# Patient Record
Sex: Male | Born: 1950 | Race: White | Hispanic: No | Marital: Married | Smoking: Former smoker
Health system: Southern US, Community
[De-identification: ages and names within clinical notes are randomized; demographics above are authoritative.]

## PROBLEM LIST (undated history)

## (undated) DIAGNOSIS — I10 Essential (primary) hypertension: Secondary | ICD-10-CM

## (undated) DIAGNOSIS — J45909 Unspecified asthma, uncomplicated: Secondary | ICD-10-CM

## (undated) DIAGNOSIS — E785 Hyperlipidemia, unspecified: Secondary | ICD-10-CM

## (undated) HISTORY — PX: SHOULDER SURGERY: SHX246

## (undated) HISTORY — PX: KNEE SURGERY: SHX244

## (undated) HISTORY — DX: Unspecified asthma, uncomplicated: J45.909

## (undated) HISTORY — DX: Essential (primary) hypertension: I10

## (undated) HISTORY — DX: Hyperlipidemia, unspecified: E78.5

---

## 2007-12-23 ENCOUNTER — Emergency Department (HOSPITAL_COMMUNITY): Admission: EM | Admit: 2007-12-23 | Discharge: 2007-12-23 | Payer: Self-pay | Admitting: Emergency Medicine

## 2008-02-27 ENCOUNTER — Encounter (INDEPENDENT_AMBULATORY_CARE_PROVIDER_SITE_OTHER): Payer: Self-pay | Admitting: Orthopedic Surgery

## 2008-02-27 ENCOUNTER — Ambulatory Visit: Payer: Self-pay | Admitting: Vascular Surgery

## 2008-02-27 ENCOUNTER — Ambulatory Visit (HOSPITAL_COMMUNITY): Admission: RE | Admit: 2008-02-27 | Discharge: 2008-02-27 | Payer: Self-pay | Admitting: Orthopedic Surgery

## 2011-06-01 LAB — POCT I-STAT, CHEM 8
BUN: 15
Calcium, Ion: 1.03 — ABNORMAL LOW
Chloride: 107
Creatinine, Ser: 1
Glucose, Bld: 105 — ABNORMAL HIGH
HCT: 40
Hemoglobin: 13.6
Potassium: 3.6
Sodium: 139
TCO2: 22

## 2011-06-01 LAB — SAMPLE TO BLOOD BANK

## 2014-10-10 DIAGNOSIS — G4733 Obstructive sleep apnea (adult) (pediatric): Secondary | ICD-10-CM | POA: Diagnosis not present

## 2015-01-08 DIAGNOSIS — G4733 Obstructive sleep apnea (adult) (pediatric): Secondary | ICD-10-CM | POA: Diagnosis not present

## 2015-04-11 DIAGNOSIS — G4733 Obstructive sleep apnea (adult) (pediatric): Secondary | ICD-10-CM | POA: Diagnosis not present

## 2015-05-19 DIAGNOSIS — Z23 Encounter for immunization: Secondary | ICD-10-CM | POA: Diagnosis not present

## 2015-05-19 DIAGNOSIS — D649 Anemia, unspecified: Secondary | ICD-10-CM | POA: Diagnosis not present

## 2015-05-19 DIAGNOSIS — I10 Essential (primary) hypertension: Secondary | ICD-10-CM | POA: Diagnosis not present

## 2015-05-19 DIAGNOSIS — Z125 Encounter for screening for malignant neoplasm of prostate: Secondary | ICD-10-CM | POA: Diagnosis not present

## 2015-05-19 DIAGNOSIS — Z9181 History of falling: Secondary | ICD-10-CM | POA: Diagnosis not present

## 2015-05-19 DIAGNOSIS — Z0000 Encounter for general adult medical examination without abnormal findings: Secondary | ICD-10-CM | POA: Diagnosis not present

## 2015-05-19 DIAGNOSIS — Z1389 Encounter for screening for other disorder: Secondary | ICD-10-CM | POA: Diagnosis not present

## 2015-05-19 DIAGNOSIS — E780 Pure hypercholesterolemia: Secondary | ICD-10-CM | POA: Diagnosis not present

## 2015-07-15 DIAGNOSIS — G4733 Obstructive sleep apnea (adult) (pediatric): Secondary | ICD-10-CM | POA: Diagnosis not present

## 2015-10-16 DIAGNOSIS — G4733 Obstructive sleep apnea (adult) (pediatric): Secondary | ICD-10-CM | POA: Diagnosis not present

## 2016-01-27 DIAGNOSIS — S76212A Strain of adductor muscle, fascia and tendon of left thigh, initial encounter: Secondary | ICD-10-CM | POA: Diagnosis not present

## 2016-05-04 DIAGNOSIS — G4733 Obstructive sleep apnea (adult) (pediatric): Secondary | ICD-10-CM | POA: Diagnosis not present

## 2016-08-03 DIAGNOSIS — Z79899 Other long term (current) drug therapy: Secondary | ICD-10-CM | POA: Diagnosis not present

## 2016-08-03 DIAGNOSIS — E785 Hyperlipidemia, unspecified: Secondary | ICD-10-CM | POA: Diagnosis not present

## 2016-08-03 DIAGNOSIS — G4730 Sleep apnea, unspecified: Secondary | ICD-10-CM | POA: Diagnosis not present

## 2016-08-03 DIAGNOSIS — G4733 Obstructive sleep apnea (adult) (pediatric): Secondary | ICD-10-CM | POA: Diagnosis not present

## 2016-08-03 DIAGNOSIS — I10 Essential (primary) hypertension: Secondary | ICD-10-CM | POA: Diagnosis not present

## 2016-08-03 DIAGNOSIS — Z0000 Encounter for general adult medical examination without abnormal findings: Secondary | ICD-10-CM | POA: Diagnosis not present

## 2016-08-03 DIAGNOSIS — L720 Epidermal cyst: Secondary | ICD-10-CM | POA: Diagnosis not present

## 2016-08-03 DIAGNOSIS — Z23 Encounter for immunization: Secondary | ICD-10-CM | POA: Diagnosis not present

## 2016-08-03 DIAGNOSIS — Z125 Encounter for screening for malignant neoplasm of prostate: Secondary | ICD-10-CM | POA: Diagnosis not present

## 2016-10-15 DIAGNOSIS — J329 Chronic sinusitis, unspecified: Secondary | ICD-10-CM | POA: Diagnosis not present

## 2016-11-01 DIAGNOSIS — G4733 Obstructive sleep apnea (adult) (pediatric): Secondary | ICD-10-CM | POA: Diagnosis not present

## 2016-11-21 DIAGNOSIS — J209 Acute bronchitis, unspecified: Secondary | ICD-10-CM | POA: Diagnosis not present

## 2016-11-21 DIAGNOSIS — R062 Wheezing: Secondary | ICD-10-CM | POA: Diagnosis not present

## 2017-02-02 DIAGNOSIS — J40 Bronchitis, not specified as acute or chronic: Secondary | ICD-10-CM | POA: Diagnosis not present

## 2017-02-02 DIAGNOSIS — G4733 Obstructive sleep apnea (adult) (pediatric): Secondary | ICD-10-CM | POA: Diagnosis not present

## 2017-02-02 DIAGNOSIS — J329 Chronic sinusitis, unspecified: Secondary | ICD-10-CM | POA: Diagnosis not present

## 2017-02-12 DIAGNOSIS — R0602 Shortness of breath: Secondary | ICD-10-CM | POA: Diagnosis not present

## 2017-02-12 DIAGNOSIS — R0600 Dyspnea, unspecified: Secondary | ICD-10-CM | POA: Diagnosis not present

## 2017-02-12 DIAGNOSIS — J324 Chronic pansinusitis: Secondary | ICD-10-CM | POA: Diagnosis not present

## 2017-02-16 DIAGNOSIS — Z1211 Encounter for screening for malignant neoplasm of colon: Secondary | ICD-10-CM | POA: Diagnosis not present

## 2017-02-16 DIAGNOSIS — Z86010 Personal history of colonic polyps: Secondary | ICD-10-CM | POA: Diagnosis not present

## 2017-02-18 DIAGNOSIS — J209 Acute bronchitis, unspecified: Secondary | ICD-10-CM | POA: Diagnosis not present

## 2017-02-18 DIAGNOSIS — J9602 Acute respiratory failure with hypercapnia: Secondary | ICD-10-CM | POA: Diagnosis not present

## 2017-02-18 DIAGNOSIS — Z87891 Personal history of nicotine dependence: Secondary | ICD-10-CM | POA: Diagnosis not present

## 2017-02-18 DIAGNOSIS — J9601 Acute respiratory failure with hypoxia: Secondary | ICD-10-CM | POA: Diagnosis not present

## 2017-02-18 DIAGNOSIS — J449 Chronic obstructive pulmonary disease, unspecified: Secondary | ICD-10-CM | POA: Diagnosis not present

## 2017-02-18 DIAGNOSIS — R0602 Shortness of breath: Secondary | ICD-10-CM | POA: Diagnosis not present

## 2017-02-18 DIAGNOSIS — E785 Hyperlipidemia, unspecified: Secondary | ICD-10-CM | POA: Diagnosis not present

## 2017-02-18 DIAGNOSIS — J440 Chronic obstructive pulmonary disease with acute lower respiratory infection: Secondary | ICD-10-CM | POA: Diagnosis not present

## 2017-02-18 DIAGNOSIS — I712 Thoracic aortic aneurysm, without rupture: Secondary | ICD-10-CM | POA: Diagnosis not present

## 2017-02-18 DIAGNOSIS — M1990 Unspecified osteoarthritis, unspecified site: Secondary | ICD-10-CM | POA: Diagnosis not present

## 2017-02-18 DIAGNOSIS — Z79899 Other long term (current) drug therapy: Secondary | ICD-10-CM | POA: Diagnosis not present

## 2017-02-18 DIAGNOSIS — J441 Chronic obstructive pulmonary disease with (acute) exacerbation: Secondary | ICD-10-CM | POA: Diagnosis not present

## 2017-02-18 DIAGNOSIS — I10 Essential (primary) hypertension: Secondary | ICD-10-CM | POA: Diagnosis not present

## 2017-02-18 DIAGNOSIS — R062 Wheezing: Secondary | ICD-10-CM | POA: Diagnosis not present

## 2017-02-18 DIAGNOSIS — J9600 Acute respiratory failure, unspecified whether with hypoxia or hypercapnia: Secondary | ICD-10-CM | POA: Diagnosis not present

## 2017-02-18 DIAGNOSIS — E870 Hyperosmolality and hypernatremia: Secondary | ICD-10-CM | POA: Diagnosis not present

## 2017-02-18 DIAGNOSIS — Z7982 Long term (current) use of aspirin: Secondary | ICD-10-CM | POA: Diagnosis not present

## 2017-02-21 DIAGNOSIS — J449 Chronic obstructive pulmonary disease, unspecified: Secondary | ICD-10-CM

## 2017-03-01 DIAGNOSIS — J9612 Chronic respiratory failure with hypercapnia: Secondary | ICD-10-CM | POA: Diagnosis not present

## 2017-03-01 DIAGNOSIS — J9611 Chronic respiratory failure with hypoxia: Secondary | ICD-10-CM | POA: Diagnosis not present

## 2017-03-01 DIAGNOSIS — Z9981 Dependence on supplemental oxygen: Secondary | ICD-10-CM | POA: Diagnosis not present

## 2017-03-01 DIAGNOSIS — J449 Chronic obstructive pulmonary disease, unspecified: Secondary | ICD-10-CM | POA: Diagnosis not present

## 2017-03-01 DIAGNOSIS — R739 Hyperglycemia, unspecified: Secondary | ICD-10-CM | POA: Diagnosis not present

## 2017-03-01 DIAGNOSIS — Z87891 Personal history of nicotine dependence: Secondary | ICD-10-CM | POA: Diagnosis not present

## 2017-03-21 DIAGNOSIS — G4733 Obstructive sleep apnea (adult) (pediatric): Secondary | ICD-10-CM | POA: Diagnosis not present

## 2017-03-21 DIAGNOSIS — J4520 Mild intermittent asthma, uncomplicated: Secondary | ICD-10-CM | POA: Diagnosis not present

## 2017-03-23 DIAGNOSIS — J449 Chronic obstructive pulmonary disease, unspecified: Secondary | ICD-10-CM | POA: Diagnosis not present

## 2017-04-23 DIAGNOSIS — J449 Chronic obstructive pulmonary disease, unspecified: Secondary | ICD-10-CM | POA: Diagnosis not present

## 2017-04-27 DIAGNOSIS — G4733 Obstructive sleep apnea (adult) (pediatric): Secondary | ICD-10-CM | POA: Diagnosis not present

## 2017-05-16 DIAGNOSIS — R05 Cough: Secondary | ICD-10-CM | POA: Diagnosis not present

## 2017-05-16 DIAGNOSIS — J449 Chronic obstructive pulmonary disease, unspecified: Secondary | ICD-10-CM | POA: Diagnosis not present

## 2017-05-16 DIAGNOSIS — G4733 Obstructive sleep apnea (adult) (pediatric): Secondary | ICD-10-CM | POA: Diagnosis not present

## 2017-05-24 DIAGNOSIS — J449 Chronic obstructive pulmonary disease, unspecified: Secondary | ICD-10-CM | POA: Diagnosis not present

## 2017-06-21 DIAGNOSIS — G4733 Obstructive sleep apnea (adult) (pediatric): Secondary | ICD-10-CM | POA: Diagnosis not present

## 2017-06-23 DIAGNOSIS — J449 Chronic obstructive pulmonary disease, unspecified: Secondary | ICD-10-CM | POA: Diagnosis not present

## 2017-06-24 DIAGNOSIS — J441 Chronic obstructive pulmonary disease with (acute) exacerbation: Secondary | ICD-10-CM | POA: Diagnosis not present

## 2017-06-24 DIAGNOSIS — G4733 Obstructive sleep apnea (adult) (pediatric): Secondary | ICD-10-CM | POA: Diagnosis not present

## 2017-06-27 DIAGNOSIS — G4733 Obstructive sleep apnea (adult) (pediatric): Secondary | ICD-10-CM | POA: Diagnosis not present

## 2017-06-27 DIAGNOSIS — J441 Chronic obstructive pulmonary disease with (acute) exacerbation: Secondary | ICD-10-CM | POA: Diagnosis not present

## 2017-07-11 DIAGNOSIS — G4733 Obstructive sleep apnea (adult) (pediatric): Secondary | ICD-10-CM | POA: Diagnosis not present

## 2017-07-22 DIAGNOSIS — Z23 Encounter for immunization: Secondary | ICD-10-CM | POA: Diagnosis not present

## 2017-07-24 DIAGNOSIS — J449 Chronic obstructive pulmonary disease, unspecified: Secondary | ICD-10-CM | POA: Diagnosis not present

## 2017-08-01 DIAGNOSIS — J441 Chronic obstructive pulmonary disease with (acute) exacerbation: Secondary | ICD-10-CM | POA: Diagnosis not present

## 2017-08-23 DIAGNOSIS — J449 Chronic obstructive pulmonary disease, unspecified: Secondary | ICD-10-CM | POA: Diagnosis not present

## 2017-09-16 DIAGNOSIS — J449 Chronic obstructive pulmonary disease, unspecified: Secondary | ICD-10-CM | POA: Diagnosis not present

## 2017-09-23 DIAGNOSIS — J449 Chronic obstructive pulmonary disease, unspecified: Secondary | ICD-10-CM | POA: Diagnosis not present

## 2017-10-03 DIAGNOSIS — J449 Chronic obstructive pulmonary disease, unspecified: Secondary | ICD-10-CM | POA: Diagnosis not present

## 2017-10-03 DIAGNOSIS — G4733 Obstructive sleep apnea (adult) (pediatric): Secondary | ICD-10-CM | POA: Diagnosis not present

## 2017-10-04 DIAGNOSIS — G4733 Obstructive sleep apnea (adult) (pediatric): Secondary | ICD-10-CM | POA: Diagnosis not present

## 2017-10-12 DIAGNOSIS — I10 Essential (primary) hypertension: Secondary | ICD-10-CM | POA: Diagnosis not present

## 2017-10-12 DIAGNOSIS — E785 Hyperlipidemia, unspecified: Secondary | ICD-10-CM | POA: Diagnosis not present

## 2017-10-12 DIAGNOSIS — Z125 Encounter for screening for malignant neoplasm of prostate: Secondary | ICD-10-CM | POA: Diagnosis not present

## 2017-10-12 DIAGNOSIS — Z79899 Other long term (current) drug therapy: Secondary | ICD-10-CM | POA: Diagnosis not present

## 2017-10-12 DIAGNOSIS — I712 Thoracic aortic aneurysm, without rupture: Secondary | ICD-10-CM | POA: Diagnosis not present

## 2017-10-12 DIAGNOSIS — Z0000 Encounter for general adult medical examination without abnormal findings: Secondary | ICD-10-CM | POA: Diagnosis not present

## 2017-10-12 DIAGNOSIS — R739 Hyperglycemia, unspecified: Secondary | ICD-10-CM | POA: Diagnosis not present

## 2017-10-24 DIAGNOSIS — J449 Chronic obstructive pulmonary disease, unspecified: Secondary | ICD-10-CM | POA: Diagnosis not present

## 2017-10-27 DIAGNOSIS — G4733 Obstructive sleep apnea (adult) (pediatric): Secondary | ICD-10-CM | POA: Diagnosis not present

## 2017-10-27 DIAGNOSIS — J441 Chronic obstructive pulmonary disease with (acute) exacerbation: Secondary | ICD-10-CM | POA: Diagnosis not present

## 2017-10-31 DIAGNOSIS — G4733 Obstructive sleep apnea (adult) (pediatric): Secondary | ICD-10-CM | POA: Diagnosis not present

## 2017-11-21 DIAGNOSIS — J449 Chronic obstructive pulmonary disease, unspecified: Secondary | ICD-10-CM | POA: Diagnosis not present

## 2017-11-22 DIAGNOSIS — J449 Chronic obstructive pulmonary disease, unspecified: Secondary | ICD-10-CM | POA: Diagnosis not present

## 2017-11-22 DIAGNOSIS — G4730 Sleep apnea, unspecified: Secondary | ICD-10-CM | POA: Diagnosis not present

## 2017-11-22 DIAGNOSIS — J209 Acute bronchitis, unspecified: Secondary | ICD-10-CM | POA: Diagnosis not present

## 2017-11-22 DIAGNOSIS — R05 Cough: Secondary | ICD-10-CM | POA: Diagnosis not present

## 2017-11-28 DIAGNOSIS — J441 Chronic obstructive pulmonary disease with (acute) exacerbation: Secondary | ICD-10-CM | POA: Diagnosis not present

## 2017-11-28 DIAGNOSIS — G4733 Obstructive sleep apnea (adult) (pediatric): Secondary | ICD-10-CM | POA: Diagnosis not present

## 2017-11-28 DIAGNOSIS — J301 Allergic rhinitis due to pollen: Secondary | ICD-10-CM | POA: Diagnosis not present

## 2017-12-08 DIAGNOSIS — J441 Chronic obstructive pulmonary disease with (acute) exacerbation: Secondary | ICD-10-CM | POA: Diagnosis not present

## 2017-12-19 DIAGNOSIS — J449 Chronic obstructive pulmonary disease, unspecified: Secondary | ICD-10-CM | POA: Diagnosis not present

## 2017-12-19 DIAGNOSIS — G4730 Sleep apnea, unspecified: Secondary | ICD-10-CM | POA: Diagnosis not present

## 2017-12-19 DIAGNOSIS — J301 Allergic rhinitis due to pollen: Secondary | ICD-10-CM | POA: Diagnosis not present

## 2017-12-22 DIAGNOSIS — J449 Chronic obstructive pulmonary disease, unspecified: Secondary | ICD-10-CM | POA: Diagnosis not present

## 2018-01-21 DIAGNOSIS — J449 Chronic obstructive pulmonary disease, unspecified: Secondary | ICD-10-CM | POA: Diagnosis not present

## 2018-01-30 DIAGNOSIS — G4733 Obstructive sleep apnea (adult) (pediatric): Secondary | ICD-10-CM | POA: Diagnosis not present

## 2018-02-21 DIAGNOSIS — J449 Chronic obstructive pulmonary disease, unspecified: Secondary | ICD-10-CM | POA: Diagnosis not present

## 2018-03-20 DIAGNOSIS — J4520 Mild intermittent asthma, uncomplicated: Secondary | ICD-10-CM | POA: Diagnosis not present

## 2018-03-20 DIAGNOSIS — J301 Allergic rhinitis due to pollen: Secondary | ICD-10-CM | POA: Diagnosis not present

## 2018-03-20 DIAGNOSIS — G4730 Sleep apnea, unspecified: Secondary | ICD-10-CM | POA: Diagnosis not present

## 2018-03-20 DIAGNOSIS — J449 Chronic obstructive pulmonary disease, unspecified: Secondary | ICD-10-CM | POA: Diagnosis not present

## 2018-03-21 DIAGNOSIS — L02212 Cutaneous abscess of back [any part, except buttock]: Secondary | ICD-10-CM | POA: Diagnosis not present

## 2018-03-23 DIAGNOSIS — J449 Chronic obstructive pulmonary disease, unspecified: Secondary | ICD-10-CM | POA: Diagnosis not present

## 2018-04-03 DIAGNOSIS — L720 Epidermal cyst: Secondary | ICD-10-CM | POA: Diagnosis not present

## 2018-04-12 DIAGNOSIS — I10 Essential (primary) hypertension: Secondary | ICD-10-CM | POA: Diagnosis not present

## 2018-04-12 DIAGNOSIS — Z79899 Other long term (current) drug therapy: Secondary | ICD-10-CM | POA: Diagnosis not present

## 2018-04-12 DIAGNOSIS — E785 Hyperlipidemia, unspecified: Secondary | ICD-10-CM | POA: Diagnosis not present

## 2018-04-12 DIAGNOSIS — I712 Thoracic aortic aneurysm, without rupture: Secondary | ICD-10-CM | POA: Diagnosis not present

## 2018-04-12 DIAGNOSIS — G4730 Sleep apnea, unspecified: Secondary | ICD-10-CM | POA: Diagnosis not present

## 2018-04-19 DIAGNOSIS — I719 Aortic aneurysm of unspecified site, without rupture: Secondary | ICD-10-CM | POA: Diagnosis not present

## 2018-04-19 DIAGNOSIS — I712 Thoracic aortic aneurysm, without rupture: Secondary | ICD-10-CM | POA: Diagnosis not present

## 2018-04-23 DIAGNOSIS — J449 Chronic obstructive pulmonary disease, unspecified: Secondary | ICD-10-CM | POA: Diagnosis not present

## 2018-05-01 DIAGNOSIS — G4733 Obstructive sleep apnea (adult) (pediatric): Secondary | ICD-10-CM | POA: Diagnosis not present

## 2018-05-22 DIAGNOSIS — J301 Allergic rhinitis due to pollen: Secondary | ICD-10-CM | POA: Diagnosis not present

## 2018-05-22 DIAGNOSIS — J4530 Mild persistent asthma, uncomplicated: Secondary | ICD-10-CM | POA: Diagnosis not present

## 2018-05-22 DIAGNOSIS — G4730 Sleep apnea, unspecified: Secondary | ICD-10-CM | POA: Diagnosis not present

## 2018-05-24 DIAGNOSIS — J449 Chronic obstructive pulmonary disease, unspecified: Secondary | ICD-10-CM | POA: Diagnosis not present

## 2018-06-23 DIAGNOSIS — J449 Chronic obstructive pulmonary disease, unspecified: Secondary | ICD-10-CM | POA: Diagnosis not present

## 2018-07-21 DIAGNOSIS — H2513 Age-related nuclear cataract, bilateral: Secondary | ICD-10-CM | POA: Diagnosis not present

## 2018-07-21 DIAGNOSIS — H35362 Drusen (degenerative) of macula, left eye: Secondary | ICD-10-CM | POA: Diagnosis not present

## 2018-07-24 DIAGNOSIS — J449 Chronic obstructive pulmonary disease, unspecified: Secondary | ICD-10-CM | POA: Diagnosis not present

## 2018-07-31 DIAGNOSIS — G4733 Obstructive sleep apnea (adult) (pediatric): Secondary | ICD-10-CM | POA: Diagnosis not present

## 2018-08-10 DIAGNOSIS — Z23 Encounter for immunization: Secondary | ICD-10-CM | POA: Diagnosis not present

## 2018-08-21 DIAGNOSIS — J301 Allergic rhinitis due to pollen: Secondary | ICD-10-CM | POA: Diagnosis not present

## 2018-08-21 DIAGNOSIS — G4730 Sleep apnea, unspecified: Secondary | ICD-10-CM | POA: Diagnosis not present

## 2018-08-21 DIAGNOSIS — J4520 Mild intermittent asthma, uncomplicated: Secondary | ICD-10-CM | POA: Diagnosis not present

## 2018-08-23 DIAGNOSIS — J449 Chronic obstructive pulmonary disease, unspecified: Secondary | ICD-10-CM | POA: Diagnosis not present

## 2018-08-24 DIAGNOSIS — G4733 Obstructive sleep apnea (adult) (pediatric): Secondary | ICD-10-CM | POA: Diagnosis not present

## 2018-09-11 DIAGNOSIS — J4520 Mild intermittent asthma, uncomplicated: Secondary | ICD-10-CM | POA: Diagnosis not present

## 2018-09-11 DIAGNOSIS — G4730 Sleep apnea, unspecified: Secondary | ICD-10-CM | POA: Diagnosis not present

## 2018-09-11 DIAGNOSIS — J301 Allergic rhinitis due to pollen: Secondary | ICD-10-CM | POA: Diagnosis not present

## 2018-09-28 DIAGNOSIS — H2513 Age-related nuclear cataract, bilateral: Secondary | ICD-10-CM | POA: Diagnosis not present

## 2018-09-28 DIAGNOSIS — H25043 Posterior subcapsular polar age-related cataract, bilateral: Secondary | ICD-10-CM | POA: Diagnosis not present

## 2018-09-28 DIAGNOSIS — H02831 Dermatochalasis of right upper eyelid: Secondary | ICD-10-CM | POA: Diagnosis not present

## 2018-09-28 DIAGNOSIS — H2512 Age-related nuclear cataract, left eye: Secondary | ICD-10-CM | POA: Diagnosis not present

## 2018-09-28 DIAGNOSIS — H18413 Arcus senilis, bilateral: Secondary | ICD-10-CM | POA: Diagnosis not present

## 2018-10-06 DIAGNOSIS — H2512 Age-related nuclear cataract, left eye: Secondary | ICD-10-CM | POA: Diagnosis not present

## 2018-10-06 DIAGNOSIS — H2511 Age-related nuclear cataract, right eye: Secondary | ICD-10-CM | POA: Diagnosis not present

## 2018-10-27 DIAGNOSIS — H2511 Age-related nuclear cataract, right eye: Secondary | ICD-10-CM | POA: Diagnosis not present

## 2018-10-30 DIAGNOSIS — G4733 Obstructive sleep apnea (adult) (pediatric): Secondary | ICD-10-CM | POA: Diagnosis not present

## 2018-11-17 DIAGNOSIS — I10 Essential (primary) hypertension: Secondary | ICD-10-CM | POA: Diagnosis not present

## 2018-11-17 DIAGNOSIS — Z0000 Encounter for general adult medical examination without abnormal findings: Secondary | ICD-10-CM | POA: Diagnosis not present

## 2018-11-17 DIAGNOSIS — G4730 Sleep apnea, unspecified: Secondary | ICD-10-CM | POA: Diagnosis not present

## 2018-11-17 DIAGNOSIS — E785 Hyperlipidemia, unspecified: Secondary | ICD-10-CM | POA: Diagnosis not present

## 2018-11-17 DIAGNOSIS — Z1159 Encounter for screening for other viral diseases: Secondary | ICD-10-CM | POA: Diagnosis not present

## 2018-11-17 DIAGNOSIS — I712 Thoracic aortic aneurysm, without rupture: Secondary | ICD-10-CM | POA: Diagnosis not present

## 2018-11-17 DIAGNOSIS — Z79899 Other long term (current) drug therapy: Secondary | ICD-10-CM | POA: Diagnosis not present

## 2019-01-31 DIAGNOSIS — G4733 Obstructive sleep apnea (adult) (pediatric): Secondary | ICD-10-CM | POA: Diagnosis not present

## 2019-02-19 DIAGNOSIS — J4520 Mild intermittent asthma, uncomplicated: Secondary | ICD-10-CM | POA: Diagnosis not present

## 2019-02-19 DIAGNOSIS — G4730 Sleep apnea, unspecified: Secondary | ICD-10-CM | POA: Diagnosis not present

## 2019-02-19 DIAGNOSIS — J301 Allergic rhinitis due to pollen: Secondary | ICD-10-CM | POA: Diagnosis not present

## 2019-03-29 DIAGNOSIS — M19011 Primary osteoarthritis, right shoulder: Secondary | ICD-10-CM | POA: Diagnosis not present

## 2019-04-26 DIAGNOSIS — I10 Essential (primary) hypertension: Secondary | ICD-10-CM | POA: Diagnosis not present

## 2019-04-26 DIAGNOSIS — G4730 Sleep apnea, unspecified: Secondary | ICD-10-CM | POA: Diagnosis not present

## 2019-04-26 DIAGNOSIS — R739 Hyperglycemia, unspecified: Secondary | ICD-10-CM | POA: Diagnosis not present

## 2019-04-26 DIAGNOSIS — E785 Hyperlipidemia, unspecified: Secondary | ICD-10-CM | POA: Diagnosis not present

## 2019-04-26 DIAGNOSIS — I712 Thoracic aortic aneurysm, without rupture: Secondary | ICD-10-CM | POA: Diagnosis not present

## 2019-04-30 DIAGNOSIS — I719 Aortic aneurysm of unspecified site, without rupture: Secondary | ICD-10-CM | POA: Diagnosis not present

## 2019-04-30 DIAGNOSIS — I712 Thoracic aortic aneurysm, without rupture: Secondary | ICD-10-CM | POA: Diagnosis not present

## 2019-05-01 DIAGNOSIS — G4733 Obstructive sleep apnea (adult) (pediatric): Secondary | ICD-10-CM | POA: Diagnosis not present

## 2019-05-25 DIAGNOSIS — M25511 Pain in right shoulder: Secondary | ICD-10-CM | POA: Diagnosis not present

## 2019-05-25 DIAGNOSIS — S43401A Unspecified sprain of right shoulder joint, initial encounter: Secondary | ICD-10-CM | POA: Diagnosis not present

## 2019-05-31 DIAGNOSIS — M19011 Primary osteoarthritis, right shoulder: Secondary | ICD-10-CM | POA: Diagnosis not present

## 2019-05-31 DIAGNOSIS — M67911 Unspecified disorder of synovium and tendon, right shoulder: Secondary | ICD-10-CM | POA: Diagnosis not present

## 2019-06-16 DIAGNOSIS — Z23 Encounter for immunization: Secondary | ICD-10-CM | POA: Diagnosis not present

## 2019-06-28 DIAGNOSIS — M7551 Bursitis of right shoulder: Secondary | ICD-10-CM | POA: Diagnosis not present

## 2019-06-28 DIAGNOSIS — M19011 Primary osteoarthritis, right shoulder: Secondary | ICD-10-CM | POA: Diagnosis not present

## 2019-07-13 DIAGNOSIS — Z79899 Other long term (current) drug therapy: Secondary | ICD-10-CM | POA: Diagnosis not present

## 2019-07-13 DIAGNOSIS — M67911 Unspecified disorder of synovium and tendon, right shoulder: Secondary | ICD-10-CM | POA: Diagnosis not present

## 2019-07-13 DIAGNOSIS — Z7982 Long term (current) use of aspirin: Secondary | ICD-10-CM | POA: Diagnosis not present

## 2019-07-13 DIAGNOSIS — S43421A Sprain of right rotator cuff capsule, initial encounter: Secondary | ICD-10-CM | POA: Diagnosis not present

## 2019-07-13 DIAGNOSIS — M24111 Other articular cartilage disorders, right shoulder: Secondary | ICD-10-CM | POA: Diagnosis not present

## 2019-07-13 DIAGNOSIS — M75111 Incomplete rotator cuff tear or rupture of right shoulder, not specified as traumatic: Secondary | ICD-10-CM | POA: Diagnosis not present

## 2019-07-13 DIAGNOSIS — M75101 Unspecified rotator cuff tear or rupture of right shoulder, not specified as traumatic: Secondary | ICD-10-CM | POA: Diagnosis not present

## 2019-07-13 DIAGNOSIS — M7521 Bicipital tendinitis, right shoulder: Secondary | ICD-10-CM | POA: Diagnosis not present

## 2019-07-13 DIAGNOSIS — S43431A Superior glenoid labrum lesion of right shoulder, initial encounter: Secondary | ICD-10-CM | POA: Diagnosis not present

## 2019-07-13 DIAGNOSIS — G8918 Other acute postprocedural pain: Secondary | ICD-10-CM | POA: Diagnosis not present

## 2019-07-13 DIAGNOSIS — M19011 Primary osteoarthritis, right shoulder: Secondary | ICD-10-CM | POA: Diagnosis not present

## 2019-07-13 DIAGNOSIS — M7551 Bursitis of right shoulder: Secondary | ICD-10-CM | POA: Diagnosis not present

## 2019-07-13 DIAGNOSIS — E7800 Pure hypercholesterolemia, unspecified: Secondary | ICD-10-CM | POA: Diagnosis not present

## 2019-07-13 DIAGNOSIS — J449 Chronic obstructive pulmonary disease, unspecified: Secondary | ICD-10-CM | POA: Diagnosis not present

## 2019-07-13 DIAGNOSIS — M1990 Unspecified osteoarthritis, unspecified site: Secondary | ICD-10-CM | POA: Diagnosis not present

## 2019-07-13 DIAGNOSIS — G4730 Sleep apnea, unspecified: Secondary | ICD-10-CM | POA: Diagnosis not present

## 2019-07-13 DIAGNOSIS — Z8719 Personal history of other diseases of the digestive system: Secondary | ICD-10-CM | POA: Diagnosis not present

## 2019-07-13 DIAGNOSIS — I10 Essential (primary) hypertension: Secondary | ICD-10-CM | POA: Diagnosis not present

## 2019-07-17 DIAGNOSIS — M25511 Pain in right shoulder: Secondary | ICD-10-CM | POA: Diagnosis not present

## 2019-07-17 DIAGNOSIS — M6281 Muscle weakness (generalized): Secondary | ICD-10-CM | POA: Diagnosis not present

## 2019-07-17 DIAGNOSIS — M25611 Stiffness of right shoulder, not elsewhere classified: Secondary | ICD-10-CM | POA: Diagnosis not present

## 2019-07-19 DIAGNOSIS — M6281 Muscle weakness (generalized): Secondary | ICD-10-CM | POA: Diagnosis not present

## 2019-07-19 DIAGNOSIS — M25611 Stiffness of right shoulder, not elsewhere classified: Secondary | ICD-10-CM | POA: Diagnosis not present

## 2019-07-19 DIAGNOSIS — M25511 Pain in right shoulder: Secondary | ICD-10-CM | POA: Diagnosis not present

## 2019-07-23 DIAGNOSIS — M25611 Stiffness of right shoulder, not elsewhere classified: Secondary | ICD-10-CM | POA: Diagnosis not present

## 2019-07-23 DIAGNOSIS — M25511 Pain in right shoulder: Secondary | ICD-10-CM | POA: Diagnosis not present

## 2019-07-23 DIAGNOSIS — M6281 Muscle weakness (generalized): Secondary | ICD-10-CM | POA: Diagnosis not present

## 2019-07-26 DIAGNOSIS — M25511 Pain in right shoulder: Secondary | ICD-10-CM | POA: Diagnosis not present

## 2019-07-26 DIAGNOSIS — M25611 Stiffness of right shoulder, not elsewhere classified: Secondary | ICD-10-CM | POA: Diagnosis not present

## 2019-07-26 DIAGNOSIS — M6281 Muscle weakness (generalized): Secondary | ICD-10-CM | POA: Diagnosis not present

## 2019-07-30 DIAGNOSIS — M25611 Stiffness of right shoulder, not elsewhere classified: Secondary | ICD-10-CM | POA: Diagnosis not present

## 2019-07-30 DIAGNOSIS — M25511 Pain in right shoulder: Secondary | ICD-10-CM | POA: Diagnosis not present

## 2019-07-30 DIAGNOSIS — M6281 Muscle weakness (generalized): Secondary | ICD-10-CM | POA: Diagnosis not present

## 2019-08-03 DIAGNOSIS — M25511 Pain in right shoulder: Secondary | ICD-10-CM | POA: Diagnosis not present

## 2019-08-03 DIAGNOSIS — M25611 Stiffness of right shoulder, not elsewhere classified: Secondary | ICD-10-CM | POA: Diagnosis not present

## 2019-08-03 DIAGNOSIS — M6281 Muscle weakness (generalized): Secondary | ICD-10-CM | POA: Diagnosis not present

## 2019-08-09 DIAGNOSIS — J4520 Mild intermittent asthma, uncomplicated: Secondary | ICD-10-CM | POA: Diagnosis not present

## 2019-08-09 DIAGNOSIS — G4730 Sleep apnea, unspecified: Secondary | ICD-10-CM | POA: Diagnosis not present

## 2019-08-09 DIAGNOSIS — J301 Allergic rhinitis due to pollen: Secondary | ICD-10-CM | POA: Diagnosis not present

## 2019-08-10 DIAGNOSIS — G4733 Obstructive sleep apnea (adult) (pediatric): Secondary | ICD-10-CM | POA: Diagnosis not present

## 2019-08-13 DIAGNOSIS — M6281 Muscle weakness (generalized): Secondary | ICD-10-CM | POA: Diagnosis not present

## 2019-08-13 DIAGNOSIS — M25511 Pain in right shoulder: Secondary | ICD-10-CM | POA: Diagnosis not present

## 2019-08-13 DIAGNOSIS — M25611 Stiffness of right shoulder, not elsewhere classified: Secondary | ICD-10-CM | POA: Diagnosis not present

## 2019-08-16 DIAGNOSIS — M6281 Muscle weakness (generalized): Secondary | ICD-10-CM | POA: Diagnosis not present

## 2019-08-16 DIAGNOSIS — M25611 Stiffness of right shoulder, not elsewhere classified: Secondary | ICD-10-CM | POA: Diagnosis not present

## 2019-08-16 DIAGNOSIS — M25511 Pain in right shoulder: Secondary | ICD-10-CM | POA: Diagnosis not present

## 2019-08-21 DIAGNOSIS — M25611 Stiffness of right shoulder, not elsewhere classified: Secondary | ICD-10-CM | POA: Diagnosis not present

## 2019-08-21 DIAGNOSIS — M6281 Muscle weakness (generalized): Secondary | ICD-10-CM | POA: Diagnosis not present

## 2019-08-21 DIAGNOSIS — M25511 Pain in right shoulder: Secondary | ICD-10-CM | POA: Diagnosis not present

## 2019-08-23 DIAGNOSIS — G4730 Sleep apnea, unspecified: Secondary | ICD-10-CM | POA: Diagnosis not present

## 2019-08-23 DIAGNOSIS — J4520 Mild intermittent asthma, uncomplicated: Secondary | ICD-10-CM | POA: Diagnosis not present

## 2019-08-23 DIAGNOSIS — J301 Allergic rhinitis due to pollen: Secondary | ICD-10-CM | POA: Diagnosis not present

## 2019-08-27 DIAGNOSIS — M6281 Muscle weakness (generalized): Secondary | ICD-10-CM | POA: Diagnosis not present

## 2019-08-27 DIAGNOSIS — M25511 Pain in right shoulder: Secondary | ICD-10-CM | POA: Diagnosis not present

## 2019-08-27 DIAGNOSIS — M25611 Stiffness of right shoulder, not elsewhere classified: Secondary | ICD-10-CM | POA: Diagnosis not present

## 2019-09-03 DIAGNOSIS — M6281 Muscle weakness (generalized): Secondary | ICD-10-CM | POA: Diagnosis not present

## 2019-09-03 DIAGNOSIS — M25611 Stiffness of right shoulder, not elsewhere classified: Secondary | ICD-10-CM | POA: Diagnosis not present

## 2019-09-03 DIAGNOSIS — M25511 Pain in right shoulder: Secondary | ICD-10-CM | POA: Diagnosis not present

## 2019-09-10 DIAGNOSIS — M25511 Pain in right shoulder: Secondary | ICD-10-CM | POA: Diagnosis not present

## 2019-09-10 DIAGNOSIS — M25611 Stiffness of right shoulder, not elsewhere classified: Secondary | ICD-10-CM | POA: Diagnosis not present

## 2019-09-10 DIAGNOSIS — M6281 Muscle weakness (generalized): Secondary | ICD-10-CM | POA: Diagnosis not present

## 2019-09-13 DIAGNOSIS — M6281 Muscle weakness (generalized): Secondary | ICD-10-CM | POA: Diagnosis not present

## 2019-09-13 DIAGNOSIS — M25511 Pain in right shoulder: Secondary | ICD-10-CM | POA: Diagnosis not present

## 2019-09-13 DIAGNOSIS — M25611 Stiffness of right shoulder, not elsewhere classified: Secondary | ICD-10-CM | POA: Diagnosis not present

## 2019-09-17 DIAGNOSIS — M25511 Pain in right shoulder: Secondary | ICD-10-CM | POA: Diagnosis not present

## 2019-09-17 DIAGNOSIS — M6281 Muscle weakness (generalized): Secondary | ICD-10-CM | POA: Diagnosis not present

## 2019-09-17 DIAGNOSIS — M25611 Stiffness of right shoulder, not elsewhere classified: Secondary | ICD-10-CM | POA: Diagnosis not present

## 2019-09-20 DIAGNOSIS — M6281 Muscle weakness (generalized): Secondary | ICD-10-CM | POA: Diagnosis not present

## 2019-09-20 DIAGNOSIS — M25611 Stiffness of right shoulder, not elsewhere classified: Secondary | ICD-10-CM | POA: Diagnosis not present

## 2019-09-20 DIAGNOSIS — M25511 Pain in right shoulder: Secondary | ICD-10-CM | POA: Diagnosis not present

## 2019-09-24 DIAGNOSIS — M25511 Pain in right shoulder: Secondary | ICD-10-CM | POA: Diagnosis not present

## 2019-09-24 DIAGNOSIS — M6281 Muscle weakness (generalized): Secondary | ICD-10-CM | POA: Diagnosis not present

## 2019-09-24 DIAGNOSIS — M25611 Stiffness of right shoulder, not elsewhere classified: Secondary | ICD-10-CM | POA: Diagnosis not present

## 2019-09-26 DIAGNOSIS — G4733 Obstructive sleep apnea (adult) (pediatric): Secondary | ICD-10-CM | POA: Diagnosis not present

## 2019-09-27 DIAGNOSIS — M6281 Muscle weakness (generalized): Secondary | ICD-10-CM | POA: Diagnosis not present

## 2019-09-27 DIAGNOSIS — M25511 Pain in right shoulder: Secondary | ICD-10-CM | POA: Diagnosis not present

## 2019-09-27 DIAGNOSIS — M25611 Stiffness of right shoulder, not elsewhere classified: Secondary | ICD-10-CM | POA: Diagnosis not present

## 2019-10-01 DIAGNOSIS — M25511 Pain in right shoulder: Secondary | ICD-10-CM | POA: Diagnosis not present

## 2019-10-01 DIAGNOSIS — M25611 Stiffness of right shoulder, not elsewhere classified: Secondary | ICD-10-CM | POA: Diagnosis not present

## 2019-10-01 DIAGNOSIS — M6281 Muscle weakness (generalized): Secondary | ICD-10-CM | POA: Diagnosis not present

## 2019-10-08 DIAGNOSIS — G4733 Obstructive sleep apnea (adult) (pediatric): Secondary | ICD-10-CM | POA: Diagnosis not present

## 2019-11-05 DIAGNOSIS — G4733 Obstructive sleep apnea (adult) (pediatric): Secondary | ICD-10-CM | POA: Diagnosis not present

## 2019-11-15 DIAGNOSIS — J301 Allergic rhinitis due to pollen: Secondary | ICD-10-CM | POA: Diagnosis not present

## 2019-11-15 DIAGNOSIS — J4520 Mild intermittent asthma, uncomplicated: Secondary | ICD-10-CM | POA: Diagnosis not present

## 2019-11-15 DIAGNOSIS — G4730 Sleep apnea, unspecified: Secondary | ICD-10-CM | POA: Diagnosis not present

## 2019-12-06 DIAGNOSIS — G4733 Obstructive sleep apnea (adult) (pediatric): Secondary | ICD-10-CM | POA: Diagnosis not present

## 2019-12-20 DIAGNOSIS — R739 Hyperglycemia, unspecified: Secondary | ICD-10-CM | POA: Diagnosis not present

## 2019-12-20 DIAGNOSIS — Z0000 Encounter for general adult medical examination without abnormal findings: Secondary | ICD-10-CM | POA: Diagnosis not present

## 2019-12-20 DIAGNOSIS — I739 Peripheral vascular disease, unspecified: Secondary | ICD-10-CM | POA: Diagnosis not present

## 2019-12-20 DIAGNOSIS — I712 Thoracic aortic aneurysm, without rupture: Secondary | ICD-10-CM | POA: Diagnosis not present

## 2019-12-20 DIAGNOSIS — E785 Hyperlipidemia, unspecified: Secondary | ICD-10-CM | POA: Diagnosis not present

## 2019-12-20 DIAGNOSIS — Z79899 Other long term (current) drug therapy: Secondary | ICD-10-CM | POA: Diagnosis not present

## 2019-12-20 DIAGNOSIS — I10 Essential (primary) hypertension: Secondary | ICD-10-CM | POA: Diagnosis not present

## 2020-01-02 DIAGNOSIS — M75101 Unspecified rotator cuff tear or rupture of right shoulder, not specified as traumatic: Secondary | ICD-10-CM | POA: Diagnosis not present

## 2020-01-04 DIAGNOSIS — E7800 Pure hypercholesterolemia, unspecified: Secondary | ICD-10-CM | POA: Diagnosis not present

## 2020-01-04 DIAGNOSIS — I10 Essential (primary) hypertension: Secondary | ICD-10-CM | POA: Diagnosis not present

## 2020-01-18 DIAGNOSIS — G4730 Sleep apnea, unspecified: Secondary | ICD-10-CM | POA: Diagnosis not present

## 2020-01-31 DIAGNOSIS — G4733 Obstructive sleep apnea (adult) (pediatric): Secondary | ICD-10-CM | POA: Diagnosis not present

## 2020-01-31 DIAGNOSIS — J449 Chronic obstructive pulmonary disease, unspecified: Secondary | ICD-10-CM | POA: Diagnosis not present

## 2020-02-04 DIAGNOSIS — E785 Hyperlipidemia, unspecified: Secondary | ICD-10-CM | POA: Diagnosis not present

## 2020-02-04 DIAGNOSIS — I739 Peripheral vascular disease, unspecified: Secondary | ICD-10-CM | POA: Diagnosis not present

## 2020-02-04 DIAGNOSIS — I10 Essential (primary) hypertension: Secondary | ICD-10-CM | POA: Diagnosis not present

## 2020-02-07 DIAGNOSIS — J301 Allergic rhinitis due to pollen: Secondary | ICD-10-CM | POA: Diagnosis not present

## 2020-02-07 DIAGNOSIS — J4530 Mild persistent asthma, uncomplicated: Secondary | ICD-10-CM | POA: Diagnosis not present

## 2020-02-07 DIAGNOSIS — G4730 Sleep apnea, unspecified: Secondary | ICD-10-CM | POA: Diagnosis not present

## 2020-03-09 DIAGNOSIS — R05 Cough: Secondary | ICD-10-CM | POA: Diagnosis not present

## 2020-03-09 DIAGNOSIS — J45909 Unspecified asthma, uncomplicated: Secondary | ICD-10-CM | POA: Diagnosis not present

## 2020-03-09 DIAGNOSIS — R0600 Dyspnea, unspecified: Secondary | ICD-10-CM | POA: Diagnosis not present

## 2020-03-14 DIAGNOSIS — S22060S Wedge compression fracture of T7-T8 vertebra, sequela: Secondary | ICD-10-CM | POA: Diagnosis not present

## 2020-03-14 DIAGNOSIS — M8580 Other specified disorders of bone density and structure, unspecified site: Secondary | ICD-10-CM | POA: Diagnosis not present

## 2020-03-14 DIAGNOSIS — M810 Age-related osteoporosis without current pathological fracture: Secondary | ICD-10-CM | POA: Diagnosis not present

## 2020-03-14 DIAGNOSIS — M545 Low back pain: Secondary | ICD-10-CM | POA: Diagnosis not present

## 2020-03-14 DIAGNOSIS — J449 Chronic obstructive pulmonary disease, unspecified: Secondary | ICD-10-CM | POA: Diagnosis not present

## 2020-03-20 DIAGNOSIS — M8008XA Age-related osteoporosis with current pathological fracture, vertebra(e), initial encounter for fracture: Secondary | ICD-10-CM | POA: Diagnosis not present

## 2020-03-20 DIAGNOSIS — S22060A Wedge compression fracture of T7-T8 vertebra, initial encounter for closed fracture: Secondary | ICD-10-CM | POA: Diagnosis not present

## 2020-03-20 DIAGNOSIS — M4726 Other spondylosis with radiculopathy, lumbar region: Secondary | ICD-10-CM | POA: Diagnosis not present

## 2020-03-20 DIAGNOSIS — S32020A Wedge compression fracture of second lumbar vertebra, initial encounter for closed fracture: Secondary | ICD-10-CM | POA: Diagnosis not present

## 2020-03-24 DIAGNOSIS — M48061 Spinal stenosis, lumbar region without neurogenic claudication: Secondary | ICD-10-CM | POA: Diagnosis not present

## 2020-03-24 DIAGNOSIS — M4807 Spinal stenosis, lumbosacral region: Secondary | ICD-10-CM | POA: Diagnosis not present

## 2020-03-24 DIAGNOSIS — M4856XA Collapsed vertebra, not elsewhere classified, lumbar region, initial encounter for fracture: Secondary | ICD-10-CM | POA: Diagnosis not present

## 2020-03-24 DIAGNOSIS — S22060A Wedge compression fracture of T7-T8 vertebra, initial encounter for closed fracture: Secondary | ICD-10-CM | POA: Diagnosis not present

## 2020-03-24 DIAGNOSIS — M5126 Other intervertebral disc displacement, lumbar region: Secondary | ICD-10-CM | POA: Diagnosis not present

## 2020-03-24 DIAGNOSIS — M4854XA Collapsed vertebra, not elsewhere classified, thoracic region, initial encounter for fracture: Secondary | ICD-10-CM | POA: Diagnosis not present

## 2020-03-27 DIAGNOSIS — M5116 Intervertebral disc disorders with radiculopathy, lumbar region: Secondary | ICD-10-CM | POA: Diagnosis not present

## 2020-03-27 DIAGNOSIS — M8008XD Age-related osteoporosis with current pathological fracture, vertebra(e), subsequent encounter for fracture with routine healing: Secondary | ICD-10-CM | POA: Diagnosis not present

## 2020-03-27 DIAGNOSIS — M5432 Sciatica, left side: Secondary | ICD-10-CM | POA: Diagnosis not present

## 2020-03-27 DIAGNOSIS — M4726 Other spondylosis with radiculopathy, lumbar region: Secondary | ICD-10-CM | POA: Diagnosis not present

## 2020-04-30 DIAGNOSIS — G4733 Obstructive sleep apnea (adult) (pediatric): Secondary | ICD-10-CM | POA: Diagnosis not present

## 2020-05-01 DIAGNOSIS — M5432 Sciatica, left side: Secondary | ICD-10-CM | POA: Diagnosis not present

## 2020-05-01 DIAGNOSIS — M47816 Spondylosis without myelopathy or radiculopathy, lumbar region: Secondary | ICD-10-CM | POA: Diagnosis not present

## 2020-05-01 DIAGNOSIS — M5116 Intervertebral disc disorders with radiculopathy, lumbar region: Secondary | ICD-10-CM | POA: Diagnosis not present

## 2020-06-09 DIAGNOSIS — M47816 Spondylosis without myelopathy or radiculopathy, lumbar region: Secondary | ICD-10-CM | POA: Diagnosis not present

## 2020-06-27 DIAGNOSIS — E785 Hyperlipidemia, unspecified: Secondary | ICD-10-CM | POA: Diagnosis not present

## 2020-06-27 DIAGNOSIS — I712 Thoracic aortic aneurysm, without rupture: Secondary | ICD-10-CM | POA: Diagnosis not present

## 2020-06-27 DIAGNOSIS — I70213 Atherosclerosis of native arteries of extremities with intermittent claudication, bilateral legs: Secondary | ICD-10-CM | POA: Diagnosis not present

## 2020-06-27 DIAGNOSIS — I700 Atherosclerosis of aorta: Secondary | ICD-10-CM | POA: Diagnosis not present

## 2020-06-27 DIAGNOSIS — J410 Simple chronic bronchitis: Secondary | ICD-10-CM | POA: Diagnosis not present

## 2020-07-01 DIAGNOSIS — M47816 Spondylosis without myelopathy or radiculopathy, lumbar region: Secondary | ICD-10-CM | POA: Diagnosis not present

## 2020-07-03 DIAGNOSIS — M40294 Other kyphosis, thoracic region: Secondary | ICD-10-CM | POA: Diagnosis not present

## 2020-07-03 DIAGNOSIS — I700 Atherosclerosis of aorta: Secondary | ICD-10-CM | POA: Diagnosis not present

## 2020-07-03 DIAGNOSIS — I712 Thoracic aortic aneurysm, without rupture: Secondary | ICD-10-CM | POA: Diagnosis not present

## 2020-07-03 DIAGNOSIS — I2510 Atherosclerotic heart disease of native coronary artery without angina pectoris: Secondary | ICD-10-CM | POA: Diagnosis not present

## 2020-07-05 DIAGNOSIS — E785 Hyperlipidemia, unspecified: Secondary | ICD-10-CM | POA: Diagnosis not present

## 2020-07-05 DIAGNOSIS — I739 Peripheral vascular disease, unspecified: Secondary | ICD-10-CM | POA: Diagnosis not present

## 2020-07-05 DIAGNOSIS — I10 Essential (primary) hypertension: Secondary | ICD-10-CM | POA: Diagnosis not present

## 2020-07-22 DIAGNOSIS — R0981 Nasal congestion: Secondary | ICD-10-CM | POA: Diagnosis not present

## 2020-07-22 DIAGNOSIS — R0600 Dyspnea, unspecified: Secondary | ICD-10-CM | POA: Diagnosis not present

## 2020-07-22 DIAGNOSIS — J069 Acute upper respiratory infection, unspecified: Secondary | ICD-10-CM | POA: Diagnosis not present

## 2020-07-22 DIAGNOSIS — J0100 Acute maxillary sinusitis, unspecified: Secondary | ICD-10-CM | POA: Diagnosis not present

## 2020-08-05 DIAGNOSIS — M5136 Other intervertebral disc degeneration, lumbar region: Secondary | ICD-10-CM | POA: Diagnosis not present

## 2020-08-05 DIAGNOSIS — M47816 Spondylosis without myelopathy or radiculopathy, lumbar region: Secondary | ICD-10-CM | POA: Diagnosis not present

## 2020-08-05 DIAGNOSIS — M5416 Radiculopathy, lumbar region: Secondary | ICD-10-CM | POA: Diagnosis not present

## 2020-08-06 DIAGNOSIS — G4733 Obstructive sleep apnea (adult) (pediatric): Secondary | ICD-10-CM | POA: Diagnosis not present

## 2020-08-07 DIAGNOSIS — J301 Allergic rhinitis due to pollen: Secondary | ICD-10-CM | POA: Diagnosis not present

## 2020-08-07 DIAGNOSIS — J4520 Mild intermittent asthma, uncomplicated: Secondary | ICD-10-CM | POA: Diagnosis not present

## 2020-08-07 DIAGNOSIS — G4730 Sleep apnea, unspecified: Secondary | ICD-10-CM | POA: Diagnosis not present

## 2020-08-07 DIAGNOSIS — J069 Acute upper respiratory infection, unspecified: Secondary | ICD-10-CM | POA: Diagnosis not present

## 2020-08-12 DIAGNOSIS — G4733 Obstructive sleep apnea (adult) (pediatric): Secondary | ICD-10-CM | POA: Diagnosis not present

## 2020-08-12 DIAGNOSIS — J449 Chronic obstructive pulmonary disease, unspecified: Secondary | ICD-10-CM | POA: Diagnosis not present

## 2020-08-13 DIAGNOSIS — M5416 Radiculopathy, lumbar region: Secondary | ICD-10-CM | POA: Diagnosis not present

## 2020-09-04 DIAGNOSIS — Z7189 Other specified counseling: Secondary | ICD-10-CM | POA: Diagnosis not present

## 2020-09-04 DIAGNOSIS — G4730 Sleep apnea, unspecified: Secondary | ICD-10-CM | POA: Diagnosis not present

## 2020-09-04 DIAGNOSIS — J4520 Mild intermittent asthma, uncomplicated: Secondary | ICD-10-CM | POA: Diagnosis not present

## 2020-09-04 DIAGNOSIS — J301 Allergic rhinitis due to pollen: Secondary | ICD-10-CM | POA: Diagnosis not present

## 2020-09-04 DIAGNOSIS — I10 Essential (primary) hypertension: Secondary | ICD-10-CM | POA: Diagnosis not present

## 2020-09-04 DIAGNOSIS — E785 Hyperlipidemia, unspecified: Secondary | ICD-10-CM | POA: Diagnosis not present

## 2020-09-06 DIAGNOSIS — G4733 Obstructive sleep apnea (adult) (pediatric): Secondary | ICD-10-CM | POA: Diagnosis not present

## 2020-09-10 DIAGNOSIS — I10 Essential (primary) hypertension: Secondary | ICD-10-CM | POA: Diagnosis not present

## 2020-09-10 DIAGNOSIS — M5416 Radiculopathy, lumbar region: Secondary | ICD-10-CM | POA: Diagnosis not present

## 2020-09-10 DIAGNOSIS — M47816 Spondylosis without myelopathy or radiculopathy, lumbar region: Secondary | ICD-10-CM | POA: Diagnosis not present

## 2020-09-17 DIAGNOSIS — M5416 Radiculopathy, lumbar region: Secondary | ICD-10-CM | POA: Diagnosis not present

## 2020-10-05 DIAGNOSIS — I10 Essential (primary) hypertension: Secondary | ICD-10-CM | POA: Diagnosis not present

## 2020-10-05 DIAGNOSIS — G4730 Sleep apnea, unspecified: Secondary | ICD-10-CM | POA: Diagnosis not present

## 2020-10-05 DIAGNOSIS — E785 Hyperlipidemia, unspecified: Secondary | ICD-10-CM | POA: Diagnosis not present

## 2020-10-07 DIAGNOSIS — G4733 Obstructive sleep apnea (adult) (pediatric): Secondary | ICD-10-CM | POA: Diagnosis not present

## 2020-10-08 DIAGNOSIS — M5416 Radiculopathy, lumbar region: Secondary | ICD-10-CM | POA: Diagnosis not present

## 2020-10-08 DIAGNOSIS — M47816 Spondylosis without myelopathy or radiculopathy, lumbar region: Secondary | ICD-10-CM | POA: Diagnosis not present

## 2020-10-08 DIAGNOSIS — I10 Essential (primary) hypertension: Secondary | ICD-10-CM | POA: Diagnosis not present

## 2020-11-03 DIAGNOSIS — G4730 Sleep apnea, unspecified: Secondary | ICD-10-CM | POA: Diagnosis not present

## 2020-11-03 DIAGNOSIS — E785 Hyperlipidemia, unspecified: Secondary | ICD-10-CM | POA: Diagnosis not present

## 2020-11-03 DIAGNOSIS — I10 Essential (primary) hypertension: Secondary | ICD-10-CM | POA: Diagnosis not present

## 2020-11-04 DIAGNOSIS — G4733 Obstructive sleep apnea (adult) (pediatric): Secondary | ICD-10-CM | POA: Diagnosis not present

## 2020-11-11 DIAGNOSIS — L853 Xerosis cutis: Secondary | ICD-10-CM | POA: Diagnosis not present

## 2020-11-11 DIAGNOSIS — I781 Nevus, non-neoplastic: Secondary | ICD-10-CM | POA: Diagnosis not present

## 2020-11-27 DIAGNOSIS — J301 Allergic rhinitis due to pollen: Secondary | ICD-10-CM | POA: Diagnosis not present

## 2020-11-27 DIAGNOSIS — G4730 Sleep apnea, unspecified: Secondary | ICD-10-CM | POA: Diagnosis not present

## 2020-11-27 DIAGNOSIS — J4520 Mild intermittent asthma, uncomplicated: Secondary | ICD-10-CM | POA: Diagnosis not present

## 2020-12-05 DIAGNOSIS — G4733 Obstructive sleep apnea (adult) (pediatric): Secondary | ICD-10-CM | POA: Diagnosis not present

## 2020-12-18 DIAGNOSIS — G4733 Obstructive sleep apnea (adult) (pediatric): Secondary | ICD-10-CM | POA: Diagnosis not present

## 2020-12-29 DIAGNOSIS — G4730 Sleep apnea, unspecified: Secondary | ICD-10-CM | POA: Diagnosis not present

## 2020-12-29 DIAGNOSIS — I70213 Atherosclerosis of native arteries of extremities with intermittent claudication, bilateral legs: Secondary | ICD-10-CM | POA: Diagnosis not present

## 2020-12-29 DIAGNOSIS — I712 Thoracic aortic aneurysm, without rupture: Secondary | ICD-10-CM | POA: Diagnosis not present

## 2020-12-29 DIAGNOSIS — Z0000 Encounter for general adult medical examination without abnormal findings: Secondary | ICD-10-CM | POA: Diagnosis not present

## 2020-12-29 DIAGNOSIS — R7301 Impaired fasting glucose: Secondary | ICD-10-CM | POA: Diagnosis not present

## 2020-12-29 DIAGNOSIS — E785 Hyperlipidemia, unspecified: Secondary | ICD-10-CM | POA: Diagnosis not present

## 2020-12-29 DIAGNOSIS — Z79899 Other long term (current) drug therapy: Secondary | ICD-10-CM | POA: Diagnosis not present

## 2020-12-29 DIAGNOSIS — I700 Atherosclerosis of aorta: Secondary | ICD-10-CM | POA: Diagnosis not present

## 2020-12-29 DIAGNOSIS — K429 Umbilical hernia without obstruction or gangrene: Secondary | ICD-10-CM | POA: Diagnosis not present

## 2020-12-29 DIAGNOSIS — I10 Essential (primary) hypertension: Secondary | ICD-10-CM | POA: Diagnosis not present

## 2020-12-29 DIAGNOSIS — J410 Simple chronic bronchitis: Secondary | ICD-10-CM | POA: Diagnosis not present

## 2021-01-03 DIAGNOSIS — E785 Hyperlipidemia, unspecified: Secondary | ICD-10-CM | POA: Diagnosis not present

## 2021-01-03 DIAGNOSIS — G4730 Sleep apnea, unspecified: Secondary | ICD-10-CM | POA: Diagnosis not present

## 2021-01-03 DIAGNOSIS — I10 Essential (primary) hypertension: Secondary | ICD-10-CM | POA: Diagnosis not present

## 2021-01-11 DIAGNOSIS — Z1159 Encounter for screening for other viral diseases: Secondary | ICD-10-CM | POA: Diagnosis not present

## 2021-01-12 DIAGNOSIS — M5416 Radiculopathy, lumbar region: Secondary | ICD-10-CM | POA: Diagnosis not present

## 2021-01-19 DIAGNOSIS — M5416 Radiculopathy, lumbar region: Secondary | ICD-10-CM | POA: Diagnosis not present

## 2021-02-17 DIAGNOSIS — M48062 Spinal stenosis, lumbar region with neurogenic claudication: Secondary | ICD-10-CM | POA: Diagnosis not present

## 2021-02-17 DIAGNOSIS — M4726 Other spondylosis with radiculopathy, lumbar region: Secondary | ICD-10-CM | POA: Diagnosis not present

## 2021-02-17 DIAGNOSIS — M5432 Sciatica, left side: Secondary | ICD-10-CM | POA: Diagnosis not present

## 2021-02-17 DIAGNOSIS — M5416 Radiculopathy, lumbar region: Secondary | ICD-10-CM | POA: Diagnosis not present

## 2021-02-19 DIAGNOSIS — J301 Allergic rhinitis due to pollen: Secondary | ICD-10-CM | POA: Diagnosis not present

## 2021-02-19 DIAGNOSIS — G4730 Sleep apnea, unspecified: Secondary | ICD-10-CM | POA: Diagnosis not present

## 2021-02-19 DIAGNOSIS — J4520 Mild intermittent asthma, uncomplicated: Secondary | ICD-10-CM | POA: Diagnosis not present

## 2021-03-03 DIAGNOSIS — M545 Low back pain, unspecified: Secondary | ICD-10-CM | POA: Diagnosis not present

## 2021-03-03 DIAGNOSIS — M48062 Spinal stenosis, lumbar region with neurogenic claudication: Secondary | ICD-10-CM | POA: Diagnosis not present

## 2021-03-05 DIAGNOSIS — I10 Essential (primary) hypertension: Secondary | ICD-10-CM | POA: Diagnosis not present

## 2021-03-05 DIAGNOSIS — J301 Allergic rhinitis due to pollen: Secondary | ICD-10-CM | POA: Diagnosis not present

## 2021-03-05 DIAGNOSIS — J4520 Mild intermittent asthma, uncomplicated: Secondary | ICD-10-CM | POA: Diagnosis not present

## 2021-03-05 DIAGNOSIS — G4730 Sleep apnea, unspecified: Secondary | ICD-10-CM | POA: Diagnosis not present

## 2021-03-05 DIAGNOSIS — E785 Hyperlipidemia, unspecified: Secondary | ICD-10-CM | POA: Diagnosis not present

## 2021-03-10 DIAGNOSIS — M48062 Spinal stenosis, lumbar region with neurogenic claudication: Secondary | ICD-10-CM | POA: Diagnosis not present

## 2021-03-10 DIAGNOSIS — M4726 Other spondylosis with radiculopathy, lumbar region: Secondary | ICD-10-CM | POA: Diagnosis not present

## 2021-03-10 DIAGNOSIS — M5432 Sciatica, left side: Secondary | ICD-10-CM | POA: Diagnosis not present

## 2021-03-10 DIAGNOSIS — M5416 Radiculopathy, lumbar region: Secondary | ICD-10-CM | POA: Diagnosis not present

## 2021-04-05 DIAGNOSIS — I10 Essential (primary) hypertension: Secondary | ICD-10-CM | POA: Diagnosis not present

## 2021-04-05 DIAGNOSIS — E785 Hyperlipidemia, unspecified: Secondary | ICD-10-CM | POA: Diagnosis not present

## 2021-04-05 DIAGNOSIS — G4730 Sleep apnea, unspecified: Secondary | ICD-10-CM | POA: Diagnosis not present

## 2021-04-28 DIAGNOSIS — J449 Chronic obstructive pulmonary disease, unspecified: Secondary | ICD-10-CM | POA: Diagnosis not present

## 2021-04-28 DIAGNOSIS — G4733 Obstructive sleep apnea (adult) (pediatric): Secondary | ICD-10-CM | POA: Diagnosis not present

## 2021-05-06 DIAGNOSIS — E785 Hyperlipidemia, unspecified: Secondary | ICD-10-CM | POA: Diagnosis not present

## 2021-05-06 DIAGNOSIS — G4730 Sleep apnea, unspecified: Secondary | ICD-10-CM | POA: Diagnosis not present

## 2021-05-06 DIAGNOSIS — I10 Essential (primary) hypertension: Secondary | ICD-10-CM | POA: Diagnosis not present

## 2021-05-14 DIAGNOSIS — G4730 Sleep apnea, unspecified: Secondary | ICD-10-CM | POA: Diagnosis not present

## 2021-05-14 DIAGNOSIS — J4520 Mild intermittent asthma, uncomplicated: Secondary | ICD-10-CM | POA: Diagnosis not present

## 2021-05-14 DIAGNOSIS — J301 Allergic rhinitis due to pollen: Secondary | ICD-10-CM | POA: Diagnosis not present

## 2021-06-05 DIAGNOSIS — E785 Hyperlipidemia, unspecified: Secondary | ICD-10-CM | POA: Diagnosis not present

## 2021-06-05 DIAGNOSIS — I10 Essential (primary) hypertension: Secondary | ICD-10-CM | POA: Diagnosis not present

## 2021-06-05 DIAGNOSIS — G4730 Sleep apnea, unspecified: Secondary | ICD-10-CM | POA: Diagnosis not present

## 2021-06-29 DIAGNOSIS — R7301 Impaired fasting glucose: Secondary | ICD-10-CM | POA: Diagnosis not present

## 2021-06-29 DIAGNOSIS — G4730 Sleep apnea, unspecified: Secondary | ICD-10-CM | POA: Diagnosis not present

## 2021-06-29 DIAGNOSIS — J410 Simple chronic bronchitis: Secondary | ICD-10-CM | POA: Diagnosis not present

## 2021-06-29 DIAGNOSIS — Z23 Encounter for immunization: Secondary | ICD-10-CM | POA: Diagnosis not present

## 2021-06-29 DIAGNOSIS — I70213 Atherosclerosis of native arteries of extremities with intermittent claudication, bilateral legs: Secondary | ICD-10-CM | POA: Diagnosis not present

## 2021-06-29 DIAGNOSIS — E785 Hyperlipidemia, unspecified: Secondary | ICD-10-CM | POA: Diagnosis not present

## 2021-06-29 DIAGNOSIS — Z79899 Other long term (current) drug therapy: Secondary | ICD-10-CM | POA: Diagnosis not present

## 2021-06-29 DIAGNOSIS — I700 Atherosclerosis of aorta: Secondary | ICD-10-CM | POA: Diagnosis not present

## 2021-06-29 DIAGNOSIS — I10 Essential (primary) hypertension: Secondary | ICD-10-CM | POA: Diagnosis not present

## 2021-07-07 DIAGNOSIS — G4733 Obstructive sleep apnea (adult) (pediatric): Secondary | ICD-10-CM | POA: Diagnosis not present

## 2021-07-17 DIAGNOSIS — I7121 Aneurysm of the ascending aorta, without rupture: Secondary | ICD-10-CM | POA: Diagnosis not present

## 2021-07-17 DIAGNOSIS — I712 Thoracic aortic aneurysm, without rupture, unspecified: Secondary | ICD-10-CM | POA: Diagnosis not present

## 2021-07-29 DIAGNOSIS — G4733 Obstructive sleep apnea (adult) (pediatric): Secondary | ICD-10-CM | POA: Diagnosis not present

## 2021-07-29 DIAGNOSIS — J449 Chronic obstructive pulmonary disease, unspecified: Secondary | ICD-10-CM | POA: Diagnosis not present

## 2021-07-31 DIAGNOSIS — J45901 Unspecified asthma with (acute) exacerbation: Secondary | ICD-10-CM | POA: Diagnosis not present

## 2021-07-31 DIAGNOSIS — R0981 Nasal congestion: Secondary | ICD-10-CM | POA: Diagnosis not present

## 2021-07-31 DIAGNOSIS — J0100 Acute maxillary sinusitis, unspecified: Secondary | ICD-10-CM | POA: Diagnosis not present

## 2021-08-13 DIAGNOSIS — G4730 Sleep apnea, unspecified: Secondary | ICD-10-CM | POA: Diagnosis not present

## 2021-08-13 DIAGNOSIS — J4520 Mild intermittent asthma, uncomplicated: Secondary | ICD-10-CM | POA: Diagnosis not present

## 2021-08-13 DIAGNOSIS — J301 Allergic rhinitis due to pollen: Secondary | ICD-10-CM | POA: Diagnosis not present

## 2021-10-08 DIAGNOSIS — L723 Sebaceous cyst: Secondary | ICD-10-CM | POA: Diagnosis not present

## 2021-10-08 DIAGNOSIS — L089 Local infection of the skin and subcutaneous tissue, unspecified: Secondary | ICD-10-CM | POA: Diagnosis not present

## 2021-10-08 DIAGNOSIS — J410 Simple chronic bronchitis: Secondary | ICD-10-CM | POA: Diagnosis not present

## 2021-10-27 ENCOUNTER — Other Ambulatory Visit: Payer: Self-pay | Admitting: Neurosurgery

## 2021-10-27 DIAGNOSIS — I10 Essential (primary) hypertension: Secondary | ICD-10-CM | POA: Diagnosis not present

## 2021-10-27 DIAGNOSIS — G8929 Other chronic pain: Secondary | ICD-10-CM

## 2021-10-27 DIAGNOSIS — G4733 Obstructive sleep apnea (adult) (pediatric): Secondary | ICD-10-CM | POA: Diagnosis not present

## 2021-10-27 DIAGNOSIS — J449 Chronic obstructive pulmonary disease, unspecified: Secondary | ICD-10-CM | POA: Diagnosis not present

## 2021-10-27 DIAGNOSIS — M5442 Lumbago with sciatica, left side: Secondary | ICD-10-CM | POA: Diagnosis not present

## 2021-11-03 ENCOUNTER — Ambulatory Visit
Admission: RE | Admit: 2021-11-03 | Discharge: 2021-11-03 | Disposition: A | Discharge status: Home / Self Care | Payer: Medicare Other | Source: Ambulatory Visit | Attending: Neurosurgery | Admitting: Neurosurgery

## 2021-11-03 DIAGNOSIS — E785 Hyperlipidemia, unspecified: Secondary | ICD-10-CM | POA: Diagnosis not present

## 2021-11-03 DIAGNOSIS — M47816 Spondylosis without myelopathy or radiculopathy, lumbar region: Secondary | ICD-10-CM | POA: Diagnosis not present

## 2021-11-03 DIAGNOSIS — G8929 Other chronic pain: Secondary | ICD-10-CM

## 2021-11-03 DIAGNOSIS — M5126 Other intervertebral disc displacement, lumbar region: Secondary | ICD-10-CM | POA: Diagnosis not present

## 2021-11-03 DIAGNOSIS — I10 Essential (primary) hypertension: Secondary | ICD-10-CM | POA: Diagnosis not present

## 2021-11-03 DIAGNOSIS — M4856XA Collapsed vertebra, not elsewhere classified, lumbar region, initial encounter for fracture: Secondary | ICD-10-CM | POA: Diagnosis not present

## 2021-11-03 MED ORDER — ONDANSETRON HCL 4 MG/2ML IJ SOLN: 4.0000 mg | Freq: Once | INTRAMUSCULAR | Status: DC | PRN

## 2021-11-03 MED ORDER — DIAZEPAM 5 MG PO TABS
5.0000 mg | ORAL_TABLET | Freq: Once | ORAL | Status: AC
  Administered 2021-11-03: 5 mg via ORAL

## 2021-11-03 MED ORDER — MEPERIDINE HCL 50 MG/ML IJ SOLN: 50.0000 mg | Freq: Once | INTRAMUSCULAR | Status: DC | PRN

## 2021-11-03 MED ORDER — IOPAMIDOL (ISOVUE-M 200) INJECTION 41%
20.0000 mL | Freq: Once | INTRAMUSCULAR | Status: AC
  Administered 2021-11-03: 20 mL via INTRATHECAL

## 2021-11-03 NOTE — Discharge Instructions (Signed)

## 2021-11-17 DIAGNOSIS — I10 Essential (primary) hypertension: Secondary | ICD-10-CM | POA: Diagnosis not present

## 2021-11-17 DIAGNOSIS — M79605 Pain in left leg: Secondary | ICD-10-CM | POA: Diagnosis not present

## 2021-11-17 DIAGNOSIS — M5442 Lumbago with sciatica, left side: Secondary | ICD-10-CM | POA: Diagnosis not present

## 2021-11-19 DIAGNOSIS — R911 Solitary pulmonary nodule: Secondary | ICD-10-CM | POA: Diagnosis not present

## 2021-11-19 DIAGNOSIS — J4520 Mild intermittent asthma, uncomplicated: Secondary | ICD-10-CM | POA: Diagnosis not present

## 2021-11-19 DIAGNOSIS — J301 Allergic rhinitis due to pollen: Secondary | ICD-10-CM | POA: Diagnosis not present

## 2021-11-19 DIAGNOSIS — G4730 Sleep apnea, unspecified: Secondary | ICD-10-CM | POA: Diagnosis not present

## 2021-11-27 DIAGNOSIS — J209 Acute bronchitis, unspecified: Secondary | ICD-10-CM | POA: Diagnosis not present

## 2021-12-28 ENCOUNTER — Other Ambulatory Visit: Payer: Self-pay | Admitting: *Deleted

## 2021-12-28 DIAGNOSIS — M79605 Pain in left leg: Secondary | ICD-10-CM

## 2022-01-04 DIAGNOSIS — G4733 Obstructive sleep apnea (adult) (pediatric): Secondary | ICD-10-CM | POA: Diagnosis not present

## 2022-01-11 ENCOUNTER — Other Ambulatory Visit: Payer: Self-pay

## 2022-01-11 ENCOUNTER — Encounter: Payer: Self-pay | Admitting: Surgery

## 2022-01-11 ENCOUNTER — Ambulatory Visit: Payer: Medicare Other | Admitting: Surgery

## 2022-01-11 ENCOUNTER — Ambulatory Visit (HOSPITAL_COMMUNITY)
Admission: RE | Admit: 2022-01-11 | Discharge: 2022-01-11 | Disposition: A | Discharge status: Home / Self Care | Payer: Medicare Other | Source: Ambulatory Visit | Attending: Surgery | Admitting: Surgery

## 2022-01-11 VITALS — BP 142/76 | HR 61 | Temp 98.5°F | Resp 20 | Ht 74.0 in | Wt 263.9 lb

## 2022-01-11 DIAGNOSIS — I70212 Atherosclerosis of native arteries of extremities with intermittent claudication, left leg: Secondary | ICD-10-CM | POA: Diagnosis not present

## 2022-01-11 DIAGNOSIS — M79605 Pain in left leg: Secondary | ICD-10-CM | POA: Insufficient documentation

## 2022-01-11 NOTE — H&P (View-Only) (Signed)
? ?[facility] [place] ? ?Patient name: Jeffrey Walker MRN: [id number] DOB: 01/06/1951 Sex: male ? ? ?REQUESTING PROVIDER:  ? ? M Bergman ? ? ?REASON FOR CONSULT:  ?  ?Left leg pain ? ?HISTORY OF PRESENT ILLNESS:  ? ?Jeffrey Walker is a 71 y.o. male, who is referred for evaluation of leg pain.  He has seen neurosurgery and there was concern that this may not be neuropathic but rather vascular and so he was here today to figure this out.  He describes his symptoms occurring after walking.  It comes in a variable distance.  He describes it as a stabbing pain like somebody sticking a knife in his back and it travels to his hip.  He does not report any cramping. ? ?The patient used to work as a paramedic.  He is a former smoker.  He takes a statin for hypercholesterolemia and ? ?PAST MEDICAL HISTORY  ? ? ?Past Medical History:  ?Diagnosis Date  ? Asthma   ? Hyperlipidemia   ? Hypertension   ? ? ? ?FAMILY HISTORY  ? ?History reviewed. No pertinent family history. ? ?SOCIAL HISTORY:  ? ?Social History  ? ?Socioeconomic History  ? Marital status: Married  ?  Spouse name: Not on file  ? Number of children: Not on file  ? Years of education: Not on file  ? Highest education level: Not on file  ?Occupational History  ? Not on file  ?Tobacco Use  ? Smoking status: Former  ?  Types: Cigarettes  ? Smokeless tobacco: Never  ?Vaping Use  ? Vaping Use: Never used  ?Substance and Sexual Activity  ? Alcohol use: Not Currently  ? Drug use: Never  ? Sexual activity: Not on file  ?Other Topics Concern  ? Not on file  ?Social History Narrative  ? Not on file  ? ?Social Determinants of Health  ? ?Financial Resource Strain: Not on file  ?Food Insecurity: Not on file  ?Transportation Needs: Not on file  ?Physical Activity: Not on file  ?Stress: Not on file  ?Social Connections: Not on file  ?Intimate Partner Violence: Not on file  ? ? ?ALLERGIES:  ? ? ?Allergies  ?Allergen Reactions  ?  Maple Flavor   ? Molds & Smuts   ? ? ?CURRENT MEDICATIONS:  ? ? ?Current Outpatient Medications  ?Medication Sig Dispense Refill  ? aspirin 81 MG chewable tablet     ? celecoxib (CELEBREX) 100 MG capsule Take 100 mg by mouth every other day.    ? cetirizine (ZYRTEC) 10 MG tablet     ? fluticasone (FLONASE) 50 MCG/ACT nasal spray     ? fluticasone furoate-vilanterol (BREO ELLIPTA) 100-25 MCG/ACT AEPB     ? ipratropium-albuterol (DUONEB) 0.5-2.5 (3) MG/3ML SOLN     ? montelukast (SINGULAIR) 10 MG tablet     ? mupirocin ointment (BACTROBAN) 2 % Apply topically 2 (two) times daily.    ? ramipril (ALTACE) 10 MG capsule     ? simvastatin (ZOCOR) 40 MG tablet     ? ?No current facility-administered medications for this visit.  ? ? ?REVIEW OF SYSTEMS:  ? ?[X] denotes positive finding, [ ] denotes negative finding ?Cardiac  Comments:  ?Chest pain or chest pressure:    ?Shortness of breath upon exertion:    ?Short of breath when lying flat:    ?Irregular heart rhythm:    ?    ?Vascular    ?Pain in calf, thigh, or hip   brought on by ambulation:    ?Pain in feet at night that wakes you up from your sleep:     ?Blood clot in your veins:    ?Leg swelling:     ?    ?Pulmonary    ?Oxygen at home:    ?Productive cough:     ?Wheezing:     ?    ?Neurologic    ?Sudden weakness in arms or legs:     ?Sudden numbness in arms or legs:     ?Sudden onset of difficulty speaking or slurred speech:    ?Temporary loss of vision in one eye:     ?Problems with dizziness:     ?    ?Gastrointestinal    ?Blood in stool:     ? ?Vomited blood:     ?    ?Genitourinary    ?Burning when urinating:     ?Blood in urine:    ?    ?Psychiatric    ?Major depression:     ?    ?Hematologic    ?Bleeding problems:    ?Problems with blood clotting too easily:    ?    ?Skin    ?Rashes or ulcers:    ?    ?Constitutional    ?Fever or chills:    ? ?PHYSICAL EXAM:  ? ?Vitals:  ? 01/11/22 0934  ?BP: (!) 142/76  ?Pulse: 61  ?Resp: 20  ?Temp: 98.5 ?F (36.9 ?C)  ?SpO2: 95%   ?Weight: 263 lb 14.4 oz (119.7 kg)  ?Height: 6' 2" (1.88 m)  ? ? ?GENERAL: The patient is a well-nourished male, in no acute distress. The vital signs are documented above. ?CARDIAC: There is a regular rate and rhythm.  ?VASCULAR: I had difficulty palpating femoral pulses and could not palpate pedal pulses ?PULMONARY: Nonlabored respirations ?ABDOMEN: Soft and non-tender  ?MUSCULOSKELETAL: There are no major deformities or cyanosis. ?NEUROLOGIC: No focal weakness or paresthesias are detected. ?SKIN: There are no ulcers or rashes noted. ?PSYCHIATRIC: The patient has a normal affect. ? ?STUDIES:  ? ?I have reviewed the following: ?+-------+-----------+-----------+------------+------------+  ?ABI/TBIToday's ABIToday's TBIPrevious ABIPrevious TBI  ?+-------+-----------+-----------+------------+------------+  ?Right  0.93       0.66                                 ?+-------+-----------+-----------+------------+------------+  ?Left   0.78       0.52                                 ?+-------+-----------+-----------+------------+------------+  ? ?ASSESSMENT and PLAN  ? ?Left hip pain: Patient is seen spine and scoliosis surgeon as well as neurosurgery.  There is concerned that his symptoms are related to vascular insufficiency.  He is not presenting with classic symptoms for claudication, however I have trouble palpating a femoral pulse and his ABIs are abnormal.  I discussed proceeding with angiography to better define his anatomy.  If he were to have inflow disease, this would be addressed.  I would not treat outflow or runoff disease.  I discussed that this will help rule in or rule out vascular insufficiency as a cause of his discomfort.  He is in agreement with this plan and wants to proceed.  This is been scheduled for Tuesday, May 16. ? ? ?Wells Govind Furey, IV, MD, FACS ?[facility] [place] ?  Tel [phone] ?Pager [phone]  ?

## 2022-01-11 NOTE — Progress Notes (Signed)
? ?[facility] [place] ? ?Patient name: Jeffrey Walker MRN: [id number] DOB: 03-27-1951 Sex: male ? ? ?REQUESTING PROVIDER:  ? ? Tharon Aquas ? ? ?REASON FOR CONSULT:  ?  ?Left leg pain ? ?HISTORY OF PRESENT ILLNESS:  ? ?Jeffrey Walker is a 71 y.o. male, who is referred for evaluation of leg pain.  He has seen neurosurgery and there was concern that this may not be neuropathic but rather vascular and so he was here today to figure this out.  He describes his symptoms occurring after walking.  It comes in a variable distance.  He describes it as a stabbing pain like somebody sticking a knife in his back and it travels to his hip.  He does not report any cramping. ? ?The patient used to work as a Audiological scientist.  He is a former smoker.  He takes a statin for hypercholesterolemia and ? ?PAST MEDICAL HISTORY  ? ? ?Past Medical History:  ?Diagnosis Date  ? Asthma   ? Hyperlipidemia   ? Hypertension   ? ? ? ?FAMILY HISTORY  ? ?History reviewed. No pertinent family history. ? ?SOCIAL HISTORY:  ? ?Social History  ? ?Socioeconomic History  ? Marital status: Married  ?  Spouse name: Not on file  ? Number of children: Not on file  ? Years of education: Not on file  ? Highest education level: Not on file  ?Occupational History  ? Not on file  ?Tobacco Use  ? Smoking status: Former  ?  Types: Cigarettes  ? Smokeless tobacco: Never  ?Vaping Use  ? Vaping Use: Never used  ?Substance and Sexual Activity  ? Alcohol use: Not Currently  ? Drug use: Never  ? Sexual activity: Not on file  ?Other Topics Concern  ? Not on file  ?Social History Narrative  ? Not on file  ? ?Social Determinants of Health  ? ?Financial Resource Strain: Not on file  ?Food Insecurity: Not on file  ?Transportation Needs: Not on file  ?Physical Activity: Not on file  ?Stress: Not on file  ?Social Connections: Not on file  ?Intimate Partner Violence: Not on file  ? ? ?ALLERGIES:  ? ? ?Allergies  ?Allergen Reactions  ?  Maple Flavor   ? Molds & Smuts   ? ? ?CURRENT MEDICATIONS:  ? ? ?Current Outpatient Medications  ?Medication Sig Dispense Refill  ? aspirin 81 MG chewable tablet     ? celecoxib (CELEBREX) 100 MG capsule Take 100 mg by mouth every other day.    ? cetirizine (ZYRTEC) 10 MG tablet     ? fluticasone (FLONASE) 50 MCG/ACT nasal spray     ? fluticasone furoate-vilanterol (BREO ELLIPTA) 100-25 MCG/ACT AEPB     ? ipratropium-albuterol (DUONEB) 0.5-2.5 (3) MG/3ML SOLN     ? montelukast (SINGULAIR) 10 MG tablet     ? mupirocin ointment (BACTROBAN) 2 % Apply topically 2 (two) times daily.    ? ramipril (ALTACE) 10 MG capsule     ? simvastatin (ZOCOR) 40 MG tablet     ? ?No current facility-administered medications for this visit.  ? ? ?REVIEW OF SYSTEMS:  ? ?[X]  denotes positive finding, [ ]  denotes negative finding ?Cardiac  Comments:  ?Chest pain or chest pressure:    ?Shortness of breath upon exertion:    ?Short of breath when lying flat:    ?Irregular heart rhythm:    ?    ?Vascular    ?Pain in calf, thigh, or hip  brought on by ambulation:    ?Pain in feet at night that wakes you up from your sleep:     ?Blood clot in your veins:    ?Leg swelling:     ?    ?Pulmonary    ?Oxygen at home:    ?Productive cough:     ?Wheezing:     ?    ?Neurologic    ?Sudden weakness in arms or legs:     ?Sudden numbness in arms or legs:     ?Sudden onset of difficulty speaking or slurred speech:    ?Temporary loss of vision in one eye:     ?Problems with dizziness:     ?    ?Gastrointestinal    ?Blood in stool:     ? ?Vomited blood:     ?    ?Genitourinary    ?Burning when urinating:     ?Blood in urine:    ?    ?Psychiatric    ?Major depression:     ?    ?Hematologic    ?Bleeding problems:    ?Problems with blood clotting too easily:    ?    ?Skin    ?Rashes or ulcers:    ?    ?Constitutional    ?Fever or chills:    ? ?PHYSICAL EXAM:  ? ?Vitals:  ? 01/11/22 0934  ?BP: (!) 142/76  ?Pulse: 61  ?Resp: 20  ?Temp: 98.5 ?F (36.9 ?C)  ?SpO2: 95%   ?Weight: 263 lb 14.4 oz (119.7 kg)  ?Height: 6\' 2"  (1.88 m)  ? ? ?GENERAL: The patient is a well-nourished male, in no acute distress. The vital signs are documented above. ?CARDIAC: There is a regular rate and rhythm.  ?VASCULAR: I had difficulty palpating femoral pulses and could not palpate pedal pulses ?PULMONARY: Nonlabored respirations ?ABDOMEN: Soft and non-tender  ?MUSCULOSKELETAL: There are no major deformities or cyanosis. ?NEUROLOGIC: No focal weakness or paresthesias are detected. ?SKIN: There are no ulcers or rashes noted. ?PSYCHIATRIC: The patient has a normal affect. ? ?STUDIES:  ? ?I have reviewed the following: ?+-------+-----------+-----------+------------+------------+  ?ABI/TBIToday's ABIToday's TBIPrevious ABIPrevious TBI  ?+-------+-----------+-----------+------------+------------+  ?Right  0.93       0.66                                 ?+-------+-----------+-----------+------------+------------+  ?Left   0.78       0.52                                 ?+-------+-----------+-----------+------------+------------+  ? ?ASSESSMENT and PLAN  ? ?Left hip pain: Patient is seen spine and scoliosis surgeon as well as neurosurgery.  There is concerned that his symptoms are related to vascular insufficiency.  He is not presenting with classic symptoms for claudication, however I have trouble palpating a femoral pulse and his ABIs are abnormal.  I discussed proceeding with angiography to better define his anatomy.  If he were to have inflow disease, this would be addressed.  I would not treat outflow or runoff disease.  I discussed that this will help rule in or rule out vascular insufficiency as a cause of his discomfort.  He is in agreement with this plan and wants to proceed.  This is been scheduled for Tuesday, May 16. ? ? ?Annamarie Major, IV, MD, FACS ?[facility] [place] ?  Tel [phone] ?Pager [phone]  ?

## 2022-01-12 DIAGNOSIS — I10 Essential (primary) hypertension: Secondary | ICD-10-CM | POA: Diagnosis not present

## 2022-01-12 DIAGNOSIS — G4730 Sleep apnea, unspecified: Secondary | ICD-10-CM | POA: Diagnosis not present

## 2022-01-12 DIAGNOSIS — M8589 Other specified disorders of bone density and structure, multiple sites: Secondary | ICD-10-CM | POA: Diagnosis not present

## 2022-01-12 DIAGNOSIS — R7301 Impaired fasting glucose: Secondary | ICD-10-CM | POA: Diagnosis not present

## 2022-01-12 DIAGNOSIS — Z79899 Other long term (current) drug therapy: Secondary | ICD-10-CM | POA: Diagnosis not present

## 2022-01-12 DIAGNOSIS — Z23 Encounter for immunization: Secondary | ICD-10-CM | POA: Diagnosis not present

## 2022-01-12 DIAGNOSIS — E785 Hyperlipidemia, unspecified: Secondary | ICD-10-CM | POA: Diagnosis not present

## 2022-01-12 DIAGNOSIS — M8580 Other specified disorders of bone density and structure, unspecified site: Secondary | ICD-10-CM | POA: Diagnosis not present

## 2022-01-12 DIAGNOSIS — Z0000 Encounter for general adult medical examination without abnormal findings: Secondary | ICD-10-CM | POA: Diagnosis not present

## 2022-01-12 DIAGNOSIS — J410 Simple chronic bronchitis: Secondary | ICD-10-CM | POA: Diagnosis not present

## 2022-01-19 ENCOUNTER — Other Ambulatory Visit: Payer: Self-pay

## 2022-01-19 ENCOUNTER — Observation Stay (HOSPITAL_COMMUNITY)
Admission: RE | Admit: 2022-01-19 | Discharge: 2022-01-20 | Disposition: A | Discharge status: Home / Self Care | Payer: Medicare Other | Attending: Surgery | Admitting: Surgery

## 2022-01-19 ENCOUNTER — Encounter (HOSPITAL_COMMUNITY)
Admission: RE | Disposition: A | Discharge status: Home / Self Care | Payer: Self-pay | Source: Home / Self Care | Attending: Surgery

## 2022-01-19 DIAGNOSIS — Z7982 Long term (current) use of aspirin: Secondary | ICD-10-CM | POA: Insufficient documentation

## 2022-01-19 DIAGNOSIS — Z79899 Other long term (current) drug therapy: Secondary | ICD-10-CM | POA: Insufficient documentation

## 2022-01-19 DIAGNOSIS — I10 Essential (primary) hypertension: Secondary | ICD-10-CM | POA: Diagnosis not present

## 2022-01-19 DIAGNOSIS — M25552 Pain in left hip: Secondary | ICD-10-CM | POA: Diagnosis not present

## 2022-01-19 DIAGNOSIS — I771 Stricture of artery: Secondary | ICD-10-CM | POA: Diagnosis not present

## 2022-01-19 DIAGNOSIS — J45909 Unspecified asthma, uncomplicated: Secondary | ICD-10-CM | POA: Diagnosis not present

## 2022-01-19 DIAGNOSIS — I70213 Atherosclerosis of native arteries of extremities with intermittent claudication, bilateral legs: Secondary | ICD-10-CM | POA: Diagnosis not present

## 2022-01-19 DIAGNOSIS — I70218 Atherosclerosis of native arteries of extremities with intermittent claudication, other extremity: Principal | ICD-10-CM | POA: Insufficient documentation

## 2022-01-19 DIAGNOSIS — Z87891 Personal history of nicotine dependence: Secondary | ICD-10-CM | POA: Insufficient documentation

## 2022-01-19 DIAGNOSIS — I739 Peripheral vascular disease, unspecified: Secondary | ICD-10-CM | POA: Diagnosis present

## 2022-01-19 HISTORY — PX: PERIPHERAL VASCULAR INTERVENTION: CATH118257

## 2022-01-19 HISTORY — PX: ABDOMINAL AORTOGRAM W/LOWER EXTREMITY: CATH118223

## 2022-01-19 LAB — POCT ACTIVATED CLOTTING TIME: Activated Clotting Time: 125 seconds

## 2022-01-19 LAB — POCT I-STAT, CHEM 8
BUN: 23 mg/dL (ref 8–23)
Calcium, Ion: 1.2 mmol/L (ref 1.15–1.40)
Chloride: 103 mmol/L (ref 98–111)
Creatinine, Ser: 1 mg/dL (ref 0.61–1.24)
Glucose, Bld: 106 mg/dL — ABNORMAL HIGH (ref 70–99)
HCT: 45 % (ref 39.0–52.0)
Hemoglobin: 15.3 g/dL (ref 13.0–17.0)
Potassium: 4 mmol/L (ref 3.5–5.1)
Sodium: 139 mmol/L (ref 135–145)
TCO2: 27 mmol/L (ref 22–32)

## 2022-01-19 SURGERY — ABDOMINAL AORTOGRAM W/LOWER EXTREMITY
Anesthesia: LOCAL

## 2022-01-19 MED ORDER — IODIXANOL 320 MG/ML IV SOLN
INTRAVENOUS | Status: DC | PRN
  Administered 2022-01-19: 125 mL

## 2022-01-19 MED ORDER — PROTAMINE SULFATE 10 MG/ML IV SOLN
INTRAVENOUS | Status: DC | PRN
  Administered 2022-01-19: 40 mg via INTRAVENOUS
  Administered 2022-01-19: 10 mg via INTRAVENOUS

## 2022-01-19 MED ORDER — OXYCODONE HCL 5 MG PO TABS
5.0000 mg | ORAL_TABLET | ORAL | Status: DC | PRN
  Filled 2022-01-19: qty 2

## 2022-01-19 MED ORDER — PROTAMINE SULFATE 10 MG/ML IV SOLN
INTRAVENOUS | Status: AC
  Filled 2022-01-19: qty 5

## 2022-01-19 MED ORDER — CLOPIDOGREL BISULFATE 75 MG PO TABS
ORAL_TABLET | ORAL | Status: DC | PRN
  Administered 2022-01-19: 75 mg via ORAL

## 2022-01-19 MED ORDER — ONDANSETRON HCL 4 MG/2ML IJ SOLN
4.0000 mg | Freq: Four times a day (QID) | INTRAMUSCULAR | Status: DC | PRN
Start: 2022-01-19 — End: 2022-01-20

## 2022-01-19 MED ORDER — MORPHINE SULFATE (PF) 2 MG/ML IV SOLN: 2.0000 mg | INTRAVENOUS | Status: DC | PRN

## 2022-01-19 MED ORDER — HEPARIN (PORCINE) IN NACL 1000-0.9 UT/500ML-% IV SOLN
INTRAVENOUS | Status: AC
  Filled 2022-01-19: qty 1000

## 2022-01-19 MED ORDER — MONTELUKAST SODIUM 10 MG PO TABS
10.0000 mg | ORAL_TABLET | Freq: Every day | ORAL | Status: DC
  Administered 2022-01-19: 10 mg via ORAL
  Filled 2022-01-19: qty 1

## 2022-01-19 MED ORDER — ASPIRIN EC 81 MG PO TBEC
81.0000 mg | DELAYED_RELEASE_TABLET | Freq: Every day | ORAL | Status: DC
  Administered 2022-01-20: 81 mg via ORAL
  Filled 2022-01-19 (×2): qty 1

## 2022-01-19 MED ORDER — SIMVASTATIN 20 MG PO TABS: 40.0000 mg | ORAL_TABLET | Freq: Every day | ORAL | Status: DC

## 2022-01-19 MED ORDER — MIDAZOLAM HCL 2 MG/2ML IJ SOLN
INTRAMUSCULAR | Status: AC
  Filled 2022-01-19: qty 2

## 2022-01-19 MED ORDER — FENTANYL CITRATE (PF) 100 MCG/2ML IJ SOLN
INTRAMUSCULAR | Status: DC | PRN
  Administered 2022-01-19: 25 ug via INTRAVENOUS
  Administered 2022-01-19: 50 ug via INTRAVENOUS

## 2022-01-19 MED ORDER — HEPARIN SODIUM (PORCINE) 1000 UNIT/ML IJ SOLN
INTRAMUSCULAR | Status: DC | PRN
  Administered 2022-01-19: 10000 [IU] via INTRAVENOUS

## 2022-01-19 MED ORDER — MIDAZOLAM HCL 2 MG/2ML IJ SOLN
INTRAMUSCULAR | Status: DC | PRN
  Administered 2022-01-19: 2 mg via INTRAVENOUS
  Administered 2022-01-19: 1 mg via INTRAVENOUS

## 2022-01-19 MED ORDER — HYDRALAZINE HCL 20 MG/ML IJ SOLN: 5.0000 mg | INTRAMUSCULAR | Status: DC | PRN

## 2022-01-19 MED ORDER — IPRATROPIUM-ALBUTEROL 0.5-2.5 (3) MG/3ML IN SOLN: 3.0000 mL | RESPIRATORY_TRACT | Status: DC | PRN

## 2022-01-19 MED ORDER — CLOPIDOGREL BISULFATE 75 MG PO TABS
ORAL_TABLET | ORAL | Status: AC
  Filled 2022-01-19: qty 1

## 2022-01-19 MED ORDER — SODIUM CHLORIDE 0.9 % WEIGHT BASED INFUSION
1.0000 mL/kg/h | INTRAVENOUS | Status: AC
  Administered 2022-01-19: 1 mL/kg/h via INTRAVENOUS

## 2022-01-19 MED ORDER — RAMIPRIL 5 MG PO CAPS
10.0000 mg | ORAL_CAPSULE | Freq: Every day | ORAL | Status: DC
  Administered 2022-01-20: 10 mg via ORAL
  Filled 2022-01-19: qty 2

## 2022-01-19 MED ORDER — CELECOXIB 100 MG PO CAPS
100.0000 mg | ORAL_CAPSULE | ORAL | Status: DC
  Administered 2022-01-20: 100 mg via ORAL
  Filled 2022-01-19: qty 1

## 2022-01-19 MED ORDER — FLUTICASONE FUROATE-VILANTEROL 100-25 MCG/ACT IN AEPB
1.0000 | INHALATION_SPRAY | Freq: Every day | RESPIRATORY_TRACT | Status: DC
  Administered 2022-01-20: 1 via RESPIRATORY_TRACT
  Filled 2022-01-19: qty 28

## 2022-01-19 MED ORDER — INSULIN ASPART 100 UNIT/ML IJ SOLN: 0.0000 [IU] | Freq: Three times a day (TID) | INTRAMUSCULAR | Status: DC

## 2022-01-19 MED ORDER — LABETALOL HCL 5 MG/ML IV SOLN: 10.0000 mg | INTRAVENOUS | Status: DC | PRN

## 2022-01-19 MED ORDER — SODIUM CHLORIDE 0.9% FLUSH: 3.0000 mL | Freq: Two times a day (BID) | INTRAVENOUS | Status: DC

## 2022-01-19 MED ORDER — LIDOCAINE HCL (PF) 1 % IJ SOLN
INTRAMUSCULAR | Status: AC
  Filled 2022-01-19: qty 30

## 2022-01-19 MED ORDER — SODIUM CHLORIDE 0.9% FLUSH: 3.0000 mL | INTRAVENOUS | Status: DC | PRN

## 2022-01-19 MED ORDER — FLUTICASONE PROPIONATE 50 MCG/ACT NA SUSP
1.0000 | Freq: Every day | NASAL | Status: DC
  Administered 2022-01-20: 1 via NASAL
  Filled 2022-01-19: qty 16

## 2022-01-19 MED ORDER — HEPARIN (PORCINE) IN NACL 1000-0.9 UT/500ML-% IV SOLN
INTRAVENOUS | Status: DC | PRN
  Administered 2022-01-19 (×2): 500 mL

## 2022-01-19 MED ORDER — CLOPIDOGREL BISULFATE 75 MG PO TABS
75.0000 mg | ORAL_TABLET | Freq: Every day | ORAL | Status: DC
  Administered 2022-01-20: 75 mg via ORAL
  Filled 2022-01-19: qty 1

## 2022-01-19 MED ORDER — ACETAMINOPHEN 325 MG PO TABS
650.0000 mg | ORAL_TABLET | ORAL | Status: DC | PRN
  Administered 2022-01-19: 650 mg via ORAL
  Filled 2022-01-19: qty 2

## 2022-01-19 MED ORDER — SODIUM CHLORIDE 0.9 % IV SOLN: 250.0000 mL | INTRAVENOUS | Status: DC | PRN

## 2022-01-19 MED ORDER — LORATADINE 10 MG PO TABS
10.0000 mg | ORAL_TABLET | Freq: Every day | ORAL | Status: DC
  Administered 2022-01-20: 10 mg via ORAL
  Filled 2022-01-19: qty 1

## 2022-01-19 MED ORDER — LIDOCAINE HCL (PF) 1 % IJ SOLN
INTRAMUSCULAR | Status: DC | PRN
  Administered 2022-01-19 (×2): 15 mL

## 2022-01-19 MED ORDER — SODIUM CHLORIDE 0.9 % IV SOLN: INTRAVENOUS | Status: DC

## 2022-01-19 MED ORDER — FENTANYL CITRATE (PF) 100 MCG/2ML IJ SOLN
INTRAMUSCULAR | Status: AC
  Filled 2022-01-19: qty 2

## 2022-01-19 SURGICAL SUPPLY — 16 items
CATH OMNI FLUSH 5F 65CM (CATHETERS) ×1 IMPLANT
CLOSURE PERCLOSE PROSTYLE (VASCULAR PRODUCTS) ×1 IMPLANT
GLIDEWIRE NITREX 0.018X80X5 (WIRE) ×3
GUIDEWIRE NITREX 0.018X80X5 (WIRE) IMPLANT
KIT ENCORE 26 ADVANTAGE (KITS) ×2 IMPLANT
KIT MICROPUNCTURE NIT STIFF (SHEATH) ×1 IMPLANT
KIT PV (KITS) ×3 IMPLANT
SHEATH BRITE TIP 7FR 35CM (SHEATH) ×2 IMPLANT
SHEATH PINNACLE 5F 10CM (SHEATH) ×1 IMPLANT
STENT VIABAHN 8X39X80 VBX (Permanent Stent) ×2 IMPLANT
SYR MEDRAD MARK V 150ML (SYRINGE) ×1 IMPLANT
TRANSDUCER W/STOPCOCK (MISCELLANEOUS) ×3 IMPLANT
TRAY PV CATH (CUSTOM PROCEDURE TRAY) ×3 IMPLANT
WIRE BENTSON .035X145CM (WIRE) ×1 IMPLANT
WIRE HI TORQ VERSACORE 300 (WIRE) ×1 IMPLANT
WIRE HITORQ VERSACORE ST 145CM (WIRE) ×1 IMPLANT

## 2022-01-19 NOTE — Progress Notes (Signed)
Site area: left groin arterial sheath ?Site Prior to Removal:  Level 0 ?Pressure Applied For: 20 minutes ?Manual:   yes ?Patient Status During Pull:  stable ?Post Pull Site:  Level 0 ?Post Pull Instructions Given:  yes ?Post Pull Pulses Present: left dp and pt dopplered ?Dressing Applied:  gauze and tegaderm ?Bedrest begins @ 1825 ?Comments: ?  ?

## 2022-01-19 NOTE — Interval H&P Note (Signed)
History and Physical Interval Note: ? ?01/19/2022 ?4:00 PM ? 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  The various methods of treatment have been discussed with the patient and family. After consideration of risks, benefits and other options for treatment, the patient has consented to  Procedure(s): ?ABDOMINAL AORTOGRAM W/LOWER EXTREMITY (N/A) as a surgical intervention.  The patient's history has been reviewed, patient examined, no change in status, stable for surgery.  I have reviewed the patient's chart and labs.  Questions were answered to the patient's satisfaction.   ? ? ?[name] ? ? ?

## 2022-01-19 NOTE — Op Note (Signed)
? ? ?  Patient name: Jeffrey Walker MRN: [id number] DOB: 1951/04/09 Sex: male ? ?01/19/2022 ?Pre-operative Diagnosis: Bilateral hip claudication, left greater than right ?Post-operative diagnosis:  Same ?Surgeon:  Annamarie Major ?Procedure Performed: ? 1.  Ultrasound-guided access, left femoral artery ? 2.  Ultrasound-guided access, right femoral artery ? 3.  Abdominal aortogram ? 4.  Stent, left common iliac artery ? 5.  Stent, right common iliac artery ? 6.  Conscious sedation, 47 minutes ?  ? ? ?Indications: This is a 71 year old gentleman with predominantly left hip pain.  He has seen neurosurgery and feel that this is a vascular issue.  He comes in today for angiographic evaluation. ? ?Procedure:  The patient was identified in the holding area and taken to room 8.  The patient was then placed supine on the table and prepped and draped in the usual sterile fashion.  A time out was called.  Conscious sedation was administered with the use of IV fentanyl and Versed under continuous physician and nurse monitoring.  Heart rate, blood pressure, and oxygen saturation were continuously monitored.  Total sedation time was 58minutes.  Ultrasound was used to evaluate the left common femoral artery.  It was patent .  A digital ultrasound image was acquired.  A micropuncture needle was used to access the left common femoral artery under ultrasound guidance.  An 018 wire was advanced without resistance and a micropuncture sheath was placed.  The 018 wire was removed and a benson wire was placed.  The micropuncture sheath was exchanged for a 5 french sheath.  An omniflush catheter was advanced over the wire to the level of L-1.  An abdominal angiogram was obtained.   ?Findings:  ? Aortogram: No significant renal artery stenosis was identified.  The infrarenal abdominal aorta is widely patent without significant stenosis or aneurysmal change.  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  No significant  stenosis within bilateral hypogastric or external iliac arteries.  The common femoral arteries appear widely patent. ?  ? ?Intervention: After the above images were acquired the decision was made to proceed with intervention the right common femoral artery was evaluated with ultrasound and found to be widely patent.  A digital ultrasound image was acquired.  The right common femoral artery was then cannulated with a micropuncture needle.  An 018 wire was advanced without resistance followed by placement of a micropuncture sheath.  Next an 035 versa core wire was advanced into the aorta and a 7 [place] Brite tip sheath was placed.  I then advanced a versa core wire into the aorta from the left side and a 7 [place] Brite tip sheath was then inserted into the aorta.  I selected 8 x 39 VBX stents and positioned them into the aorta.  They were inflated to 15 atm simultaneously in kissing fashion.  Completion imaging was then performed which showed resolution of the iliac stenosis.  The wires were then removed.  The patient was taken the holding area for sheath pull once coagulation profile corrects. ? ?Impression: ? #1  Successful kissing bilateral common iliac stents (VBX 8x39) for greater than 80% bilateral stenosis which was resolved after stenting. ? ? ? ?V. Annamarie Major, M.D., FACS ?[facility] [place] ?Office: [phone] ?Pager:  [phone]  ?

## 2022-01-20 ENCOUNTER — Encounter (HOSPITAL_COMMUNITY): Payer: Self-pay | Admitting: Surgery

## 2022-01-20 DIAGNOSIS — I70213 Atherosclerosis of native arteries of extremities with intermittent claudication, bilateral legs: Secondary | ICD-10-CM | POA: Diagnosis not present

## 2022-01-20 DIAGNOSIS — I771 Stricture of artery: Secondary | ICD-10-CM | POA: Diagnosis not present

## 2022-01-20 DIAGNOSIS — I70218 Atherosclerosis of native arteries of extremities with intermittent claudication, other extremity: Secondary | ICD-10-CM | POA: Diagnosis not present

## 2022-01-20 DIAGNOSIS — I10 Essential (primary) hypertension: Secondary | ICD-10-CM | POA: Diagnosis not present

## 2022-01-20 DIAGNOSIS — J45909 Unspecified asthma, uncomplicated: Secondary | ICD-10-CM | POA: Diagnosis not present

## 2022-01-20 DIAGNOSIS — Z87891 Personal history of nicotine dependence: Secondary | ICD-10-CM | POA: Diagnosis not present

## 2022-01-20 DIAGNOSIS — Z7982 Long term (current) use of aspirin: Secondary | ICD-10-CM | POA: Diagnosis not present

## 2022-01-20 DIAGNOSIS — Z79899 Other long term (current) drug therapy: Secondary | ICD-10-CM | POA: Diagnosis not present

## 2022-01-20 DIAGNOSIS — M25552 Pain in left hip: Secondary | ICD-10-CM | POA: Diagnosis not present

## 2022-01-20 LAB — CBC
HCT: 40.2 % (ref 39.0–52.0)
Hemoglobin: 13.6 g/dL (ref 13.0–17.0)
MCH: 33 pg (ref 26.0–34.0)
MCHC: 33.8 g/dL (ref 30.0–36.0)
MCV: 97.6 fL (ref 80.0–100.0)
Platelets: 142 10*3/uL — ABNORMAL LOW (ref 150–400)
RBC: 4.12 MIL/uL — ABNORMAL LOW (ref 4.22–5.81)
RDW: 13.3 % (ref 11.5–15.5)
WBC: 8.4 10*3/uL (ref 4.0–10.5)
nRBC: 0 % (ref 0.0–0.2)

## 2022-01-20 LAB — LIPID PANEL
Cholesterol: 148 mg/dL (ref 0–200)
HDL: 41 mg/dL (ref >40)
LDL Cholesterol: 78 mg/dL (ref 0–99)
Total CHOL/HDL Ratio: 3.6 RATIO
Triglycerides: 143 mg/dL (ref <150)
VLDL: 29 mg/dL (ref 0–40)

## 2022-01-20 LAB — BASIC METABOLIC PANEL
Anion gap: 7 (ref 5–15)
BUN: 19 mg/dL (ref 8–23)
CO2: 27 mmol/L (ref 22–32)
Calcium: 8.9 mg/dL (ref 8.9–10.3)
Chloride: 107 mmol/L (ref 98–111)
Creatinine, Ser: 0.77 mg/dL (ref 0.61–1.24)
GFR, Estimated: >60 mL/min (ref >60)
Glucose, Bld: 103 mg/dL — ABNORMAL HIGH (ref 70–99)
Potassium: 3.9 mmol/L (ref 3.5–5.1)
Sodium: 141 mmol/L (ref 135–145)

## 2022-01-20 LAB — HEMOGLOBIN A1C
Hgb A1c MFr Bld: 5.7 % — ABNORMAL HIGH (ref 4.8–5.6)
Mean Plasma Glucose: 116.89 mg/dL

## 2022-01-20 MED ORDER — ROSUVASTATIN CALCIUM 20 MG PO TABS
20.0000 mg | ORAL_TABLET | Freq: Every day | ORAL | Status: DC
  Administered 2022-01-20: 20 mg via ORAL
  Filled 2022-01-20: qty 1

## 2022-01-20 MED ORDER — ROSUVASTATIN CALCIUM 20 MG PO TABS: 20.0000 mg | ORAL_TABLET | Freq: Every day | ORAL | 1 refills | Status: DC

## 2022-01-20 MED ORDER — CLOPIDOGREL BISULFATE 75 MG PO TABS: 75.0000 mg | ORAL_TABLET | Freq: Every day | ORAL | 1 refills | Status: AC

## 2022-01-20 NOTE — Progress Notes (Signed)
PHARMACIST LIPID MONITORING ? ? ?Jeffrey Walker is a 71 y.o. male admitted on 01/19/2022 with intermittent claudication L-LE 2/2 atherosclerosis.  Pharmacy has been consulted to optimize lipid-lowering therapy with the indication of secondary prevention for clinical ASCVD. ? ?Recent Labs: ? ?Lipid Panel (last 6 months):   ?Lab Results  ?Component Value Date  ? CHOL 148 01/20/2022  ? TRIG 143 01/20/2022  ? HDL 41 01/20/2022  ? CHOLHDL 3.6 01/20/2022  ? VLDL 29 01/20/2022  ? [place] 78 01/20/2022  ? ? ?Hepatic function panel (last 6 months):   ?No results found for: AST, ALT, ALKPHOS, BILITOT, BILIDIR, IBILI ? ?SCr (since admission):   ?Serum creatinine: 0.77 mg/dL 01/20/22 0247 ?Estimated creatinine clearance: 113.1 mL/min ? ?Current therapy and lipid therapy tolerance ?Current lipid-lowering therapy: Simvastatin 40mg  ?Previous lipid-lowering therapies (if applicable): n/a ?Documented or reported allergies or intolerances to lipid-lowering therapies (if applicable): None ? ?Assessment:   ?Pt with no history statin intolerance on simvastatin 40mg  PTA, agreeable to switching to high intensity statin (Crestor) on discussion with pt ? ?Plan:   ? ?1.Statin intensity (high intensity recommended for all patients regardless of the LDL):  Add or increase statin to high intensity. ? ?2.Add ezetimibe (if any one of the following):   Not indicated at this time. ? ?3.Refer to lipid clinic:   No ? ?4.Follow-up with:  Primary care provider - Street, Sharon Mt, MD ? ?5.Follow-up labs after discharge:  Changes in lipid therapy were made. Check a lipid panel in 8-12 weeks then annually.    ? ? ? ?Bertis Ruddy, PharmD ?01/20/2022, 8:02 AM ? ?

## 2022-01-20 NOTE — Discharge Summary (Signed)
  Discharge Summary  Patient ID: Jeffrey Walker [id number] 71 y.o. 1951-09-01  Admit date: 01/19/2022  Discharge date and time: 01/20/2022 10:39 AM   Admitting Physician: Nada Libman, MD   Discharge Physician: same  Admission Diagnoses: PAD (peripheral artery disease) (HCC) [I73.9]  Discharge Diagnoses: same  Admission Condition: good  Discharged Condition: good  Indication for Admission: observation  Hospital Course: [name] is a 71 year old male who was brought in as an outpatient and underwent aortogram with bilateral common iliac artery stenting by Dr. Myra Gianotti on 01/19/2022.  Due to sheath pull time late in the afternoon he was kept overnight observation.  POD #1 groin catheterization sites are without hematoma.  Patient is feeling well enough for discharge home.  He will follow-up in office in 4 to 6 weeks with an aortoiliac duplex and ABIs.  He was prescribed Plavix to take in addition to aspirin as part of dual antiplatelet therapy.  His statin regimen was adjusted and reflected on his discharge med reconciliation.  He was discharged home in stable condition.  Consults: None  Treatments: surgery: Bilateral common iliac artery stenting by Dr. Myra Gianotti on 01/19/2022  Discharge Exam: See progress note 01/20/22 Vitals:   01/20/22 0600 01/20/22 0700  BP: 125/80 137/79  Pulse: (!) 56 61  Resp: (!) 8 11  Temp: 97.9 F (36.6 C) 98.1 F (36.7 C)  SpO2: 95% 96%     Disposition: Discharge disposition: 01-Home or Self Care       Patient Instructions:  Allergies as of 01/20/2022       Reactions   Maple Flavor Other (See Comments)   aggravate asthma symptoms   Molds & Smuts Other (See Comments)   aggravate asthma symptoms        Medication List     STOP taking these medications    simvastatin 40 MG tablet Commonly known as: ZOCOR       TAKE these medications    aspirin 81 MG chewable tablet Chew 81 mg by mouth daily.   celecoxib 100 MG  capsule Commonly known as: CELEBREX Take 100 mg by mouth every other day.   cetirizine 10 MG tablet Commonly known as: ZYRTEC Take 10 mg by mouth daily.   clopidogrel 75 MG tablet Commonly known as: PLAVIX Take 1 tablet (75 mg total) by mouth daily with breakfast.   fluticasone 50 MCG/ACT nasal spray Commonly known as: FLONASE 1 spray daily.   fluticasone furoate-vilanterol 100-25 MCG/ACT Aepb Commonly known as: BREO ELLIPTA 1 puff daily.   ipratropium-albuterol 0.5-2.5 (3) MG/3ML Soln Commonly known as: DUONEB 3 mLs every 4 (four) hours as needed (Asthma).   montelukast 10 MG tablet Commonly known as: SINGULAIR Take 10 mg by mouth at bedtime.   mupirocin ointment 2 % Commonly known as: BACTROBAN Apply 1 application. topically daily as needed (Wounds).   ramipril 10 MG capsule Commonly known as: ALTACE Take 10 mg by mouth daily.   rosuvastatin 20 MG tablet Commonly known as: CRESTOR Take 1 tablet (20 mg total) by mouth daily.       Activity: activity as tolerated Diet: regular diet Wound Care: keep wound clean and dry  Follow-up with [facility] in 5 weeks.  Signed: Emilie Rutter, PA-C 01/20/2022 3:14 PM [facility] Office: [phone]

## 2022-01-20 NOTE — Plan of Care (Signed)
?  Problem: Education: ?Goal: Knowledge of General Education information will improve ?Description: Including pain rating scale, medication(s)/side effects and non-pharmacologic comfort measures ?Outcome: Completed/Met ?  ?

## 2022-01-20 NOTE — Discharge Instructions (Signed)
° °  [facility] [place] ° °Discharge Instructions ° °Lower Extremity Angiogram; Angioplasty/Stenting ° °Please refer to the following instructions for your post-procedure care. Your surgeon or physician assistant will discuss any changes with you. ° °Activity ° °Avoid lifting more than 8 pounds (1 gallons of milk) for 72 hours (3 days) after your procedure. You may walk as much as you can tolerate. It's OK to drive after 72 hours. ° °Bathing/Showering ° °You may shower the day after your procedure. If you have a bandage, you may remove it at 24- 48 hours. Clean your incision site with mild soap and water. Pat the area dry with a clean towel. ° °Diet ° °Resume your pre-procedure diet. There are no special food restrictions following this procedure. All patients with peripheral vascular disease should follow a low fat/low cholesterol diet. In order to heal from your surgery, it is CRITICAL to get adequate nutrition. Your body requires vitamins, minerals, and protein. Vegetables are the best source of vitamins and minerals. Vegetables also provide the perfect balance of protein. Processed food has little nutritional value, so try to avoid this. ° °Medications ° °Resume taking all of your medications unless your doctor tells you not to. If your incision is causing pain, you may take over-the-counter pain relievers such as acetaminophen (Tylenol) ° °Follow Up ° °Follow up will be arranged at the time of your procedure. You may have an office visit scheduled or may be scheduled for surgery. Ask your surgeon if you have any questions. ° °Please call us immediately for any of the following conditions: °•Severe or worsening pain your legs or feet at rest or with walking. °•Increased pain, redness, drainage at your groin puncture site. °•Fever of 101 degrees or higher. °•If you have any mild or slow bleeding from your puncture site: lie down, apply firm constant pressure over the area with a piece of  gauze or a clean wash cloth for 30 minutes- no peeking!, call 911 right away if you are still bleeding after 30 minutes, or if the bleeding is heavy and unmanageable. ° °Reduce your risk factors of vascular disease: ° °Stop smoking. If you would like help call [facility] at 1-800-QUIT-NOW (1-800-784-8669) or [facility] at 336-586-4000. °Manage your cholesterol °Maintain a desired weight °Control your diabetes °Keep your blood pressure down ° °If you have any questions, please call the office at [phone] ° °

## 2022-01-20 NOTE — Progress Notes (Signed)
? ? ?  Subjective  - POD #1, status post kissing iliac stents ? ?No complaints this morning ? ? ?Physical Exam: ? ?Bilateral femoral cannulation sites are without hematoma and soft.  There is mild ecchymosis. ? ? ? ? ? ? ?Assessment/Plan:  POD #1 ? ?Anticipate discharge home this morning.  He will have a follow-up in 1 month with repeat ABIs and duplex. ? ?Annamarie Major ?01/20/2022 ?8:35 AM ?-- ? ?Vitals:  ? 01/20/22 0500 01/20/22 0700  ?BP: 124/83 137/79  ?Pulse: (!) 56 61  ?Resp: 11 11  ?Temp: 98 ?F (36.7 ?C) 98.1 ?F (36.7 ?C)  ?SpO2: 97% 96%  ? ? ?Intake/Output Summary (Last 24 hours) at 01/20/2022 0835 ?Last data filed at 01/20/2022 [id number] ?Gross per 24 hour  ?Intake --  ?Output 1325 ml  ?Net -1325 ml  ? ? ? ?Laboratory ?CBC ?   ?Component Value Date/Time  ? WBC 8.4 01/20/2022 0247  ? HGB 13.6 01/20/2022 0247  ? HCT 40.2 01/20/2022 0247  ? PLT 142 (L) 01/20/2022 0247  ? ? ?BMET ?   ?Component Value Date/Time  ? NA 141 01/20/2022 0247  ? K 3.9 01/20/2022 0247  ? CL 107 01/20/2022 0247  ? CO2 27 01/20/2022 0247  ? GLUCOSE 103 (H) 01/20/2022 0247  ? BUN 19 01/20/2022 0247  ? CREATININE 0.77 01/20/2022 0247  ? CALCIUM 8.9 01/20/2022 0247  ? GFRNONAA >60 01/20/2022 0247  ? ? ?COAG ?No results found for: INR, PROTIME ?No results found for: PTT ? ?Antibiotics ?Anti-infectives (From admission, onward)  ? ? None  ? ?  ? ? ? ?V. Leia Alf, M.D., FACS ?[facility] [place] ?Office: [phone] ?Pager:  [phone]  ?

## 2022-02-11 DIAGNOSIS — G4730 Sleep apnea, unspecified: Secondary | ICD-10-CM | POA: Diagnosis not present

## 2022-02-11 DIAGNOSIS — R911 Solitary pulmonary nodule: Secondary | ICD-10-CM | POA: Diagnosis not present

## 2022-02-11 DIAGNOSIS — J301 Allergic rhinitis due to pollen: Secondary | ICD-10-CM | POA: Diagnosis not present

## 2022-02-11 DIAGNOSIS — J4520 Mild intermittent asthma, uncomplicated: Secondary | ICD-10-CM | POA: Diagnosis not present

## 2022-02-16 ENCOUNTER — Other Ambulatory Visit: Payer: Self-pay | Admitting: *Deleted

## 2022-02-16 DIAGNOSIS — M79605 Pain in left leg: Secondary | ICD-10-CM

## 2022-02-16 DIAGNOSIS — I70212 Atherosclerosis of native arteries of extremities with intermittent claudication, left leg: Secondary | ICD-10-CM

## 2022-02-23 DIAGNOSIS — G4733 Obstructive sleep apnea (adult) (pediatric): Secondary | ICD-10-CM | POA: Diagnosis not present

## 2022-02-23 DIAGNOSIS — J449 Chronic obstructive pulmonary disease, unspecified: Secondary | ICD-10-CM | POA: Diagnosis not present

## 2022-03-01 ENCOUNTER — Ambulatory Visit (HOSPITAL_COMMUNITY)
Admission: RE | Admit: 2022-03-01 | Discharge: 2022-03-01 | Disposition: A | Discharge status: Home / Self Care | Payer: Medicare Other | Source: Ambulatory Visit | Attending: Surgery | Admitting: Surgery

## 2022-03-01 ENCOUNTER — Ambulatory Visit: Payer: Medicare Other | Admitting: Surgery

## 2022-03-01 ENCOUNTER — Ambulatory Visit (INDEPENDENT_AMBULATORY_CARE_PROVIDER_SITE_OTHER)
Admission: RE | Admit: 2022-03-01 | Discharge: 2022-03-01 | Disposition: A | Discharge status: Home / Self Care | Payer: Medicare Other | Source: Ambulatory Visit | Attending: Surgery | Admitting: Surgery

## 2022-03-01 ENCOUNTER — Encounter: Payer: Self-pay | Admitting: Surgery

## 2022-03-01 VITALS — BP 119/68 | HR 63 | Temp 97.8°F | Resp 18 | Ht 74.0 in | Wt 267.0 lb

## 2022-03-01 DIAGNOSIS — I70212 Atherosclerosis of native arteries of extremities with intermittent claudication, left leg: Secondary | ICD-10-CM | POA: Diagnosis not present

## 2022-03-01 DIAGNOSIS — I70213 Atherosclerosis of native arteries of extremities with intermittent claudication, bilateral legs: Secondary | ICD-10-CM

## 2022-03-01 DIAGNOSIS — M79605 Pain in left leg: Secondary | ICD-10-CM | POA: Insufficient documentation

## 2022-03-01 MED ORDER — ROSUVASTATIN CALCIUM 20 MG PO TABS: 20.0000 mg | ORAL_TABLET | Freq: Every day | ORAL | 12 refills | Status: AC

## 2022-03-01 NOTE — Progress Notes (Signed)
[facility] [place]  Patient name: Jeffrey Walker MRN: [id number] DOB: 08/31/1951 Sex: male   REASON FOR VISIT:    Follow up  HISOTRY OF PRESENT ILLNESS:   Jeffrey Walker is a 71 y.o. male, who I saw on 01-11-2022  for evaluation of leg pain.  He has seen neurosurgery and there was concern that this may not be neuropathic but rather vascular.  He described his symptoms occurring after walking.  It comes at a variable distance.  He described it as a stabbing pain like somebody sticking a knife in his back and it travels to his hip.  He did not report any cramping.  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  He is back today stating that since stenting, all of his issues have resolved.  He is no longer having hip leg or back pain  The patient used to work as a Radiation protection practitioner.  He is a former smoker.  He takes a statin for hypercholesterolemia and      PAST MEDICAL HISTORY:   Past Medical History:  Diagnosis Date   Asthma    Hyperlipidemia    Hypertension      FAMILY HISTORY:   History reviewed. No pertinent family history.  SOCIAL HISTORY:   Social History   Tobacco Use   Smoking status: Former    Types: Cigarettes   Smokeless tobacco: Never  Substance Use Topics   Alcohol use: Not Currently     ALLERGIES:   Allergies  Allergen Reactions   Maple Flavor Other (See Comments)    aggravate asthma symptoms   Molds & Smuts Other (See Comments)    aggravate asthma symptoms     CURRENT MEDICATIONS:   Current Outpatient Medications  Medication Sig Dispense Refill   aspirin 81 MG chewable tablet Chew 81 mg by mouth daily.     celecoxib (CELEBREX) 100 MG capsule Take 100 mg by mouth every other day.     cetirizine (ZYRTEC) 10 MG tablet Take 10 mg by mouth daily.     clopidogrel  (PLAVIX) 75 MG tablet Take 1 tablet (75 mg total) by mouth daily with breakfast. 90 tablet 1   fluticasone (FLONASE) 50 MCG/ACT nasal spray 1 spray daily.     fluticasone furoate-vilanterol (BREO ELLIPTA) 100-25 MCG/ACT AEPB 1 puff daily.     ipratropium-albuterol (DUONEB) 0.5-2.5 (3) MG/3ML SOLN 3 mLs every 4 (four) hours as needed (Asthma).     montelukast (SINGULAIR) 10 MG tablet Take 10 mg by mouth at bedtime.     mupirocin ointment (BACTROBAN) 2 % Apply 1 application. topically daily as needed (Wounds).     ramipril (ALTACE) 10 MG capsule Take 10 mg by mouth daily.     rosuvastatin (CRESTOR) 20 MG tablet Take 1 tablet (20 mg total) by mouth daily. 90 tablet 12   No current facility-administered medications for this visit.    REVIEW OF SYSTEMS:   [X]  denotes positive finding, [ ]  denotes negative finding Cardiac  Comments:  Chest pain or chest pressure:    Shortness of breath upon exertion:    Short of breath when lying flat:    Irregular heart rhythm:        Vascular    Pain in calf, thigh, or hip brought on by ambulation:    Pain in feet at night that wakes you up  from your sleep:     Blood clot in your veins:    Leg swelling:         Pulmonary    Oxygen at home:    Productive cough:     Wheezing:         Neurologic    Sudden weakness in arms or legs:     Sudden numbness in arms or legs:     Sudden onset of difficulty speaking or slurred speech:    Temporary loss of vision in one eye:     Problems with dizziness:         Gastrointestinal    Blood in stool:     Vomited blood:         Genitourinary    Burning when urinating:     Blood in urine:        Psychiatric    Major depression:         Hematologic    Bleeding problems:    Problems with blood clotting too easily:        Skin    Rashes or ulcers:        Constitutional    Fever or chills:      PHYSICAL EXAM:   Vitals:   03/01/22 0933  BP: 119/68  Pulse: 63  Resp: 18  Temp: 97.8 F (36.6 C)   TempSrc: Temporal  SpO2: 96%  Weight: 267 lb (121.1 kg)  Height: 6\' 2"  (1.88 m)    GENERAL: The patient is a well-nourished male, in no acute distress. The vital signs are documented above. CARDIAC: There is a regular rate and rhythm.  PULMONARY: Non-labored respirations MUSCULOSKELETAL: There are no major deformities or cyanosis. NEUROLOGIC: No focal weakness or paresthesias are detected. SKIN: There are no ulcers or rashes noted. PSYCHIATRIC: The patient has a normal affect.  STUDIES:   I have reviewed the following: +-------+-----------+-----------+------------+------------+  ABI/TBIToday's ABIToday's TBIPrevious ABIPrevious TBI  +-------+-----------+-----------+------------+------------+  Right  0.97       0.80       0.93        0.66          +-------+-----------+-----------+------------+------------+  Left   1.06       0.81       0.78        0.52          +-------+-----------+-----------+------------+------------+ Right toe pressure: 115 Left toe pressure: 116  Stenosis: +------------------+-----------+  Location          Stent        +------------------+-----------+  Right Common Iliacno stenosis  +------------------+-----------+  Left Common Iliac no stenosis  +------------------+-----------+       MEDICAL ISSUES:   Status post kissing iliac stents: His claudication symptoms have resolved.  Ultrasound today shows that the stents are widely patent and ABIs are within normal range.  He will follow-up in 6 months with repeat imaging.  Hypercholesterolemia: The patient was transition from lovastatin to Crestor in the hospital.  I gave him refills today.    Charlena Cross, MD, FACS [facility] [phone] Pager [phone]

## 2022-03-05 ENCOUNTER — Other Ambulatory Visit: Payer: Self-pay

## 2022-03-05 DIAGNOSIS — I70213 Atherosclerosis of native arteries of extremities with intermittent claudication, bilateral legs: Secondary | ICD-10-CM

## 2022-03-23 DIAGNOSIS — J45909 Unspecified asthma, uncomplicated: Secondary | ICD-10-CM | POA: Diagnosis not present

## 2022-03-23 DIAGNOSIS — J069 Acute upper respiratory infection, unspecified: Secondary | ICD-10-CM | POA: Diagnosis not present

## 2022-03-29 DIAGNOSIS — J209 Acute bronchitis, unspecified: Secondary | ICD-10-CM | POA: Diagnosis not present

## 2022-05-06 DIAGNOSIS — R911 Solitary pulmonary nodule: Secondary | ICD-10-CM | POA: Diagnosis not present

## 2022-05-06 DIAGNOSIS — J4520 Mild intermittent asthma, uncomplicated: Secondary | ICD-10-CM | POA: Diagnosis not present

## 2022-05-06 DIAGNOSIS — G4730 Sleep apnea, unspecified: Secondary | ICD-10-CM | POA: Diagnosis not present

## 2022-05-06 DIAGNOSIS — J301 Allergic rhinitis due to pollen: Secondary | ICD-10-CM | POA: Diagnosis not present

## 2022-06-23 DIAGNOSIS — M1711 Unilateral primary osteoarthritis, right knee: Secondary | ICD-10-CM | POA: Diagnosis not present

## 2022-07-07 DIAGNOSIS — G4733 Obstructive sleep apnea (adult) (pediatric): Secondary | ICD-10-CM | POA: Diagnosis not present

## 2022-07-28 DIAGNOSIS — G4733 Obstructive sleep apnea (adult) (pediatric): Secondary | ICD-10-CM | POA: Diagnosis not present

## 2022-07-28 DIAGNOSIS — J449 Chronic obstructive pulmonary disease, unspecified: Secondary | ICD-10-CM | POA: Diagnosis not present

## 2022-08-02 DIAGNOSIS — Z79899 Other long term (current) drug therapy: Secondary | ICD-10-CM | POA: Diagnosis not present

## 2022-08-02 DIAGNOSIS — I70213 Atherosclerosis of native arteries of extremities with intermittent claudication, bilateral legs: Secondary | ICD-10-CM | POA: Diagnosis not present

## 2022-08-02 DIAGNOSIS — I10 Essential (primary) hypertension: Secondary | ICD-10-CM | POA: Diagnosis not present

## 2022-08-02 DIAGNOSIS — I700 Atherosclerosis of aorta: Secondary | ICD-10-CM | POA: Diagnosis not present

## 2022-08-02 DIAGNOSIS — Z23 Encounter for immunization: Secondary | ICD-10-CM | POA: Diagnosis not present

## 2022-08-02 DIAGNOSIS — I712 Thoracic aortic aneurysm, without rupture, unspecified: Secondary | ICD-10-CM | POA: Diagnosis not present

## 2022-08-02 DIAGNOSIS — E785 Hyperlipidemia, unspecified: Secondary | ICD-10-CM | POA: Diagnosis not present

## 2022-08-05 DIAGNOSIS — J4540 Moderate persistent asthma, uncomplicated: Secondary | ICD-10-CM | POA: Diagnosis not present

## 2022-08-05 DIAGNOSIS — R911 Solitary pulmonary nodule: Secondary | ICD-10-CM | POA: Diagnosis not present

## 2022-08-05 DIAGNOSIS — J301 Allergic rhinitis due to pollen: Secondary | ICD-10-CM | POA: Diagnosis not present

## 2022-08-05 DIAGNOSIS — G4730 Sleep apnea, unspecified: Secondary | ICD-10-CM | POA: Diagnosis not present

## 2022-08-26 NOTE — Progress Notes (Signed)
HISTORY AND PHYSICAL     CC:  follow up. Requesting Provider:  Street, Stephanie Couphristopher M, *  HPI: This is a 71 y.o. male who is here today for follow up for PAD.  Pt has hx of bilateral hip claudication with left worse than right and underwent angiogram with stenting of bilateral CIA on 02/19/2022 by Dr. Myra GianottiBrabham.  Pt was last seen 03/01/2022 and at that time, all of his hip issues had resolved and was not having any hip, leg or back pain.    The pt returns today for follow up.  He denies any claudication, rest pain or non healing wounds.  He has been compliant with his asa/statin/plavix.    The pt is on a statin for cholesterol management.    The pt is on an aspirin.    Other AC:  Plavix The pt is on ACEI for hypertension.  The pt does not have diabetes. Tobacco hx:  former  Pt does not have family hx of AAA.  He states he has a thoracic aneurysm that is being followed every year with CT scan by his PCP.    Past Medical History:  Diagnosis Date   Asthma    Hyperlipidemia    Hypertension     Past Surgical History:  Procedure Laterality Date   ABDOMINAL AORTOGRAM W/LOWER EXTREMITY N/A 01/19/2022   Procedure: ABDOMINAL AORTOGRAM W/LOWER EXTREMITY;  Surgeon: Nada LibmanBrabham, Vance W, MD;  Location: [facility];  Service: Cardiovascular;  Laterality: N/A;   KNEE SURGERY Bilateral    PERIPHERAL VASCULAR INTERVENTION Bilateral 01/19/2022   Procedure: PERIPHERAL VASCULAR INTERVENTION;  Surgeon: Nada LibmanBrabham, Vance W, MD;  Location: [facility];  Service: Cardiovascular;  Laterality: Bilateral;  iliac stents   SHOULDER SURGERY Right     Allergies  Allergen Reactions   Maple Flavor Other (See Comments)    aggravate asthma symptoms   Molds & Smuts Other (See Comments)    aggravate asthma symptoms    Current Outpatient Medications  Medication Sig Dispense Refill   aspirin 81 MG chewable tablet Chew 81 mg by mouth daily.     celecoxib (CELEBREX) 100 MG capsule Take 100 mg by mouth  every other day.     cetirizine (ZYRTEC) 10 MG tablet Take 10 mg by mouth daily.     clopidogrel (PLAVIX) 75 MG tablet Take 1 tablet (75 mg total) by mouth daily with breakfast. 90 tablet 1   fluticasone (FLONASE) 50 MCG/ACT nasal spray 1 spray daily.     fluticasone furoate-vilanterol (BREO ELLIPTA) 100-25 MCG/ACT AEPB 1 puff daily.     ipratropium-albuterol (DUONEB) 0.5-2.5 (3) MG/3ML SOLN 3 mLs every 4 (four) hours as needed (Asthma).     montelukast (SINGULAIR) 10 MG tablet Take 10 mg by mouth at bedtime.     mupirocin ointment (BACTROBAN) 2 % Apply 1 application. topically daily as needed (Wounds).     ramipril (ALTACE) 10 MG capsule Take 10 mg by mouth daily.     rosuvastatin (CRESTOR) 20 MG tablet Take 1 tablet (20 mg total) by mouth daily. 90 tablet 12   No current facility-administered medications for this visit.    No family history on file.  Social History   Socioeconomic History   Marital status: Married    Spouse name: Not on file   Number of children: Not on file   Years of education: Not on file   Highest education level: Not on file  Occupational History   Not on file  Tobacco  Use   Smoking status: Former    Types: Cigarettes   Smokeless tobacco: Never  Vaping Use   Vaping Use: Never used  Substance and Sexual Activity   Alcohol use: Not Currently   Drug use: Never   Sexual activity: Not on file  Other Topics Concern   Not on file  Social History Narrative   Not on file   Social Determinants of Health   Financial Resource Strain: Not on file  Food Insecurity: Not on file  Transportation Needs: Not on file  Physical Activity: Not on file  Stress: Not on file  Social Connections: Not on file  Intimate Partner Violence: Not on file     REVIEW OF SYSTEMS:   [X]  denotes positive finding, [ ]  denotes negative finding Cardiac  Comments:  Chest pain or chest pressure:    Shortness of breath upon exertion:    Short of breath when lying flat:     Irregular heart rhythm:        Vascular    Pain in calf, thigh, or hip brought on by ambulation:    Pain in feet at night that wakes you up from your sleep:     Blood clot in your veins:    Leg swelling:         Pulmonary    Oxygen at home:    Productive cough:     Wheezing:         Neurologic    Sudden weakness in arms or legs:     Sudden numbness in arms or legs:     Sudden onset of difficulty speaking or slurred speech:    Temporary loss of vision in one eye:     Problems with dizziness:         Gastrointestinal    Blood in stool:     Vomited blood:         Genitourinary    Burning when urinating:     Blood in urine:        Psychiatric    Major depression:         Hematologic    Bleeding problems:    Problems with blood clotting too easily:        Skin    Rashes or ulcers:        Constitutional    Fever or chills:      PHYSICAL EXAMINATION:  Today's Vitals   08/31/22 0904  BP: 119/74  Pulse: 63  Resp: 18  Temp: 97.8 F (36.6 C)  TempSrc: Temporal  SpO2: 97%  Weight: 262 lb (118.8 kg)  Height: 6\' 2"  (1.88 m)   Body mass index is 33.64 kg/m.   General:  WDWN in NAD; vital signs documented above Gait: Not observed HENT: WNL, normocephalic Pulmonary: normal non-labored breathing , without wheezing Cardiac: regular HR, without carotid bruits Abdomen: soft, NT; aortic pulse is not palpable Skin: without rashes Vascular Exam/Pulses:  Right Left  Radial 2+ (normal) 2+ (normal)  DP 2+ (normal) 2+ (normal)  PT Unable to palpate Unable to palpate   Extremities: without ischemic changes, without Gangrene , without cellulitis; without open wounds Musculoskeletal: no muscle wasting or atrophy  Neurologic: A&O X 3 Psychiatric:  The pt has Normal affect.   Non-Invasive Vascular Imaging:   ABI's/TBI's on 08/31/2022: Right:  0.92/0.71 - Great toe pressure: 113 Left:  1.01/0.57 - Great toe pressure: 92  Arterial duplex on 08/31/2022: Aorta/iliac:  Patent bilateral common iliac artery stents. Findings essentially  unchanged when compared to prior study.   Previous ABI's/TBI's on 03/01/2022: Right:  0.97/0.80 - Great toe pressure: 115 Left:  1.06/0.81 - Great toe pressure:  116  Previous arterial duplex on 03/01/2022: Right Stent(s):  +---------------+--------+--------+---------+-------------------+  CIA           PSV cm/sStenosisWaveform Comments             +---------------+--------+--------+---------+-------------------+  Prox to Stent                           not well visualized  +---------------+--------+--------+---------+-------------------+  Proximal Stent 167             triphasic                     +---------------+--------+--------+---------+-------------------+  Mid Stent      133             triphasic                     +---------------+--------+--------+---------+-------------------+  Distal Stent   138             triphasic                     +---------------+--------+--------+---------+-------------------+  Distal to Stent155             triphasic                     +---------------+--------+--------+---------+-------------------+   Left Stent(s):  +---------------+--------+--------+---------+-------------------+  CIA           PSV cm/sStenosisWaveform Comments             +---------------+--------+--------+---------+-------------------+  Prox to Stent                           not well visualized  +---------------+--------+--------+---------+-------------------+  Proximal Stent                          not well visualized  +---------------+--------+--------+---------+-------------------+  Mid Stent      197             triphasic                     +---------------+--------+--------+---------+-------------------+  Distal Stent   211             triphasicno disease observed  +---------------+--------+--------+---------+-------------------+   Distal to Stent200             triphasic                     +---------------+--------+--------+---------+-------------------+   Summary:  Stenosis:  +------------------+-----------+  Location          Stent        +------------------+-----------+  Right Common Iliacno stenosis  +------------------+-----------+  Left Common Iliac no stenosis  +------------------+-----------+     ASSESSMENT/PLAN:: 71 y.o. male here for follow up for PAD with hx of bilateral hip claudication with left worse than right and underwent angiogram with stenting of bilateral CIA on 02/19/2022 by Dr. Myra Gianotti.   -pt doing well without claudication, rest pain or non healing wounds.  He has palpable DP pulses bilaterally.   -continue asa/statin/plavix -pt will f/u in one year with aortoiliac duplex and ABI.  He knows to call sooner should he have any issues before then.  Doreatha Massed, [facility] [phone]  Clinic MD:   Lenell Antu

## 2022-08-31 ENCOUNTER — Ambulatory Visit (HOSPITAL_COMMUNITY)
Admission: RE | Admit: 2022-08-31 | Discharge: 2022-08-31 | Disposition: A | Discharge status: Home / Self Care | Payer: Medicare Other | Source: Ambulatory Visit | Attending: Vascular Surgery | Admitting: Vascular Surgery

## 2022-08-31 ENCOUNTER — Ambulatory Visit: Payer: Medicare Other | Admitting: Physician Assistant

## 2022-08-31 ENCOUNTER — Ambulatory Visit (INDEPENDENT_AMBULATORY_CARE_PROVIDER_SITE_OTHER)
Admission: RE | Admit: 2022-08-31 | Discharge: 2022-08-31 | Disposition: A | Discharge status: Home / Self Care | Payer: Medicare Other | Source: Ambulatory Visit | Attending: Vascular Surgery | Admitting: Vascular Surgery

## 2022-08-31 VITALS — BP 119/74 | HR 63 | Temp 97.8°F | Resp 18 | Ht 74.0 in | Wt 262.0 lb

## 2022-08-31 DIAGNOSIS — I739 Peripheral vascular disease, unspecified: Secondary | ICD-10-CM | POA: Diagnosis not present

## 2022-08-31 DIAGNOSIS — I70213 Atherosclerosis of native arteries of extremities with intermittent claudication, bilateral legs: Secondary | ICD-10-CM | POA: Insufficient documentation

## 2022-09-10 DIAGNOSIS — J329 Chronic sinusitis, unspecified: Secondary | ICD-10-CM | POA: Diagnosis not present

## 2022-09-10 DIAGNOSIS — J40 Bronchitis, not specified as acute or chronic: Secondary | ICD-10-CM | POA: Diagnosis not present

## 2022-10-21 DIAGNOSIS — H26493 Other secondary cataract, bilateral: Secondary | ICD-10-CM | POA: Diagnosis not present

## 2022-10-21 DIAGNOSIS — H26492 Other secondary cataract, left eye: Secondary | ICD-10-CM | POA: Diagnosis not present

## 2022-10-21 DIAGNOSIS — H02831 Dermatochalasis of right upper eyelid: Secondary | ICD-10-CM | POA: Diagnosis not present

## 2022-10-21 DIAGNOSIS — Z961 Presence of intraocular lens: Secondary | ICD-10-CM | POA: Diagnosis not present

## 2022-10-21 DIAGNOSIS — H18413 Arcus senilis, bilateral: Secondary | ICD-10-CM | POA: Diagnosis not present

## 2022-11-04 DIAGNOSIS — J4540 Moderate persistent asthma, uncomplicated: Secondary | ICD-10-CM | POA: Diagnosis not present

## 2022-11-04 DIAGNOSIS — J301 Allergic rhinitis due to pollen: Secondary | ICD-10-CM | POA: Diagnosis not present

## 2022-11-04 DIAGNOSIS — R911 Solitary pulmonary nodule: Secondary | ICD-10-CM | POA: Diagnosis not present

## 2022-11-04 DIAGNOSIS — G4730 Sleep apnea, unspecified: Secondary | ICD-10-CM | POA: Diagnosis not present

## 2022-11-10 DIAGNOSIS — G4733 Obstructive sleep apnea (adult) (pediatric): Secondary | ICD-10-CM | POA: Diagnosis not present

## 2022-11-10 DIAGNOSIS — J449 Chronic obstructive pulmonary disease, unspecified: Secondary | ICD-10-CM | POA: Diagnosis not present

## 2022-11-12 DIAGNOSIS — H26491 Other secondary cataract, right eye: Secondary | ICD-10-CM | POA: Diagnosis not present

## 2022-11-18 DIAGNOSIS — Z1211 Encounter for screening for malignant neoplasm of colon: Secondary | ICD-10-CM | POA: Diagnosis not present

## 2022-11-18 DIAGNOSIS — K5730 Diverticulosis of large intestine without perforation or abscess without bleeding: Secondary | ICD-10-CM | POA: Diagnosis not present

## 2022-11-18 DIAGNOSIS — K648 Other hemorrhoids: Secondary | ICD-10-CM | POA: Diagnosis not present

## 2022-11-18 DIAGNOSIS — K635 Polyp of colon: Secondary | ICD-10-CM | POA: Diagnosis not present

## 2022-11-18 DIAGNOSIS — D123 Benign neoplasm of transverse colon: Secondary | ICD-10-CM | POA: Diagnosis not present

## 2022-11-18 DIAGNOSIS — D125 Benign neoplasm of sigmoid colon: Secondary | ICD-10-CM | POA: Diagnosis not present

## 2022-12-01 DIAGNOSIS — R0981 Nasal congestion: Secondary | ICD-10-CM | POA: Diagnosis not present

## 2022-12-01 DIAGNOSIS — J45909 Unspecified asthma, uncomplicated: Secondary | ICD-10-CM | POA: Diagnosis not present

## 2022-12-01 DIAGNOSIS — J0100 Acute maxillary sinusitis, unspecified: Secondary | ICD-10-CM | POA: Diagnosis not present

## 2022-12-21 DIAGNOSIS — M25561 Pain in right knee: Secondary | ICD-10-CM | POA: Diagnosis not present

## 2022-12-21 DIAGNOSIS — I708 Atherosclerosis of other arteries: Secondary | ICD-10-CM | POA: Diagnosis not present

## 2022-12-21 DIAGNOSIS — M1711 Unilateral primary osteoarthritis, right knee: Secondary | ICD-10-CM | POA: Diagnosis not present

## 2022-12-21 DIAGNOSIS — G8929 Other chronic pain: Secondary | ICD-10-CM | POA: Diagnosis not present

## 2022-12-28 DIAGNOSIS — I700 Atherosclerosis of aorta: Secondary | ICD-10-CM | POA: Diagnosis not present

## 2022-12-28 DIAGNOSIS — M1711 Unilateral primary osteoarthritis, right knee: Secondary | ICD-10-CM | POA: Diagnosis not present

## 2022-12-28 DIAGNOSIS — I70213 Atherosclerosis of native arteries of extremities with intermittent claudication, bilateral legs: Secondary | ICD-10-CM | POA: Diagnosis not present

## 2022-12-28 DIAGNOSIS — Z01818 Encounter for other preprocedural examination: Secondary | ICD-10-CM | POA: Diagnosis not present

## 2022-12-28 DIAGNOSIS — I712 Thoracic aortic aneurysm, without rupture, unspecified: Secondary | ICD-10-CM | POA: Diagnosis not present

## 2022-12-28 DIAGNOSIS — G4733 Obstructive sleep apnea (adult) (pediatric): Secondary | ICD-10-CM | POA: Diagnosis not present

## 2022-12-28 DIAGNOSIS — J410 Simple chronic bronchitis: Secondary | ICD-10-CM | POA: Diagnosis not present

## 2022-12-28 DIAGNOSIS — I10 Essential (primary) hypertension: Secondary | ICD-10-CM | POA: Diagnosis not present

## 2022-12-28 DIAGNOSIS — M8580 Other specified disorders of bone density and structure, unspecified site: Secondary | ICD-10-CM | POA: Diagnosis not present

## 2023-01-04 DIAGNOSIS — M170 Bilateral primary osteoarthritis of knee: Secondary | ICD-10-CM | POA: Diagnosis not present

## 2023-01-26 DIAGNOSIS — R7301 Impaired fasting glucose: Secondary | ICD-10-CM | POA: Diagnosis not present

## 2023-01-26 DIAGNOSIS — E785 Hyperlipidemia, unspecified: Secondary | ICD-10-CM | POA: Diagnosis not present

## 2023-01-26 DIAGNOSIS — M8589 Other specified disorders of bone density and structure, multiple sites: Secondary | ICD-10-CM | POA: Diagnosis not present

## 2023-01-26 DIAGNOSIS — Z79899 Other long term (current) drug therapy: Secondary | ICD-10-CM | POA: Diagnosis not present

## 2023-01-27 DIAGNOSIS — J4540 Moderate persistent asthma, uncomplicated: Secondary | ICD-10-CM | POA: Diagnosis not present

## 2023-01-27 DIAGNOSIS — Z87891 Personal history of nicotine dependence: Secondary | ICD-10-CM | POA: Diagnosis not present

## 2023-01-27 DIAGNOSIS — R911 Solitary pulmonary nodule: Secondary | ICD-10-CM | POA: Diagnosis not present

## 2023-01-27 DIAGNOSIS — G4730 Sleep apnea, unspecified: Secondary | ICD-10-CM | POA: Diagnosis not present

## 2023-01-27 DIAGNOSIS — J301 Allergic rhinitis due to pollen: Secondary | ICD-10-CM | POA: Diagnosis not present

## 2023-02-01 DIAGNOSIS — J449 Chronic obstructive pulmonary disease, unspecified: Secondary | ICD-10-CM | POA: Diagnosis not present

## 2023-02-01 DIAGNOSIS — G4733 Obstructive sleep apnea (adult) (pediatric): Secondary | ICD-10-CM | POA: Diagnosis not present

## 2023-02-02 DIAGNOSIS — M1711 Unilateral primary osteoarthritis, right knee: Secondary | ICD-10-CM | POA: Diagnosis not present

## 2023-02-02 DIAGNOSIS — Z0000 Encounter for general adult medical examination without abnormal findings: Secondary | ICD-10-CM | POA: Diagnosis not present

## 2023-02-02 DIAGNOSIS — I70213 Atherosclerosis of native arteries of extremities with intermittent claudication, bilateral legs: Secondary | ICD-10-CM | POA: Diagnosis not present

## 2023-02-02 DIAGNOSIS — M8580 Other specified disorders of bone density and structure, unspecified site: Secondary | ICD-10-CM | POA: Diagnosis not present

## 2023-02-02 DIAGNOSIS — J410 Simple chronic bronchitis: Secondary | ICD-10-CM | POA: Diagnosis not present

## 2023-02-02 DIAGNOSIS — K429 Umbilical hernia without obstruction or gangrene: Secondary | ICD-10-CM | POA: Diagnosis not present

## 2023-02-02 DIAGNOSIS — I10 Essential (primary) hypertension: Secondary | ICD-10-CM | POA: Diagnosis not present

## 2023-02-02 DIAGNOSIS — F17211 Nicotine dependence, cigarettes, in remission: Secondary | ICD-10-CM | POA: Diagnosis not present

## 2023-02-02 DIAGNOSIS — I700 Atherosclerosis of aorta: Secondary | ICD-10-CM | POA: Diagnosis not present

## 2023-02-02 DIAGNOSIS — I712 Thoracic aortic aneurysm, without rupture, unspecified: Secondary | ICD-10-CM | POA: Diagnosis not present

## 2023-02-02 DIAGNOSIS — G4733 Obstructive sleep apnea (adult) (pediatric): Secondary | ICD-10-CM | POA: Diagnosis not present

## 2023-02-02 DIAGNOSIS — E785 Hyperlipidemia, unspecified: Secondary | ICD-10-CM | POA: Diagnosis not present

## 2023-02-14 DIAGNOSIS — R0602 Shortness of breath: Secondary | ICD-10-CM | POA: Diagnosis not present

## 2023-02-15 DIAGNOSIS — Z87891 Personal history of nicotine dependence: Secondary | ICD-10-CM | POA: Diagnosis not present

## 2023-02-15 DIAGNOSIS — Z122 Encounter for screening for malignant neoplasm of respiratory organs: Secondary | ICD-10-CM | POA: Diagnosis not present

## 2023-02-22 DIAGNOSIS — Z87891 Personal history of nicotine dependence: Secondary | ICD-10-CM | POA: Diagnosis not present

## 2023-02-22 DIAGNOSIS — J4540 Moderate persistent asthma, uncomplicated: Secondary | ICD-10-CM | POA: Diagnosis not present

## 2023-02-22 DIAGNOSIS — R911 Solitary pulmonary nodule: Secondary | ICD-10-CM | POA: Diagnosis not present

## 2023-02-22 DIAGNOSIS — G4730 Sleep apnea, unspecified: Secondary | ICD-10-CM | POA: Diagnosis not present

## 2023-02-22 DIAGNOSIS — J301 Allergic rhinitis due to pollen: Secondary | ICD-10-CM | POA: Diagnosis not present

## 2023-05-03 DIAGNOSIS — J449 Chronic obstructive pulmonary disease, unspecified: Secondary | ICD-10-CM | POA: Diagnosis not present

## 2023-05-03 DIAGNOSIS — G4733 Obstructive sleep apnea (adult) (pediatric): Secondary | ICD-10-CM | POA: Diagnosis not present

## 2023-05-24 DIAGNOSIS — I77810 Thoracic aortic ectasia: Secondary | ICD-10-CM | POA: Diagnosis not present

## 2023-05-24 DIAGNOSIS — R911 Solitary pulmonary nodule: Secondary | ICD-10-CM | POA: Diagnosis not present

## 2023-05-24 DIAGNOSIS — J439 Emphysema, unspecified: Secondary | ICD-10-CM | POA: Diagnosis not present

## 2023-05-24 DIAGNOSIS — I700 Atherosclerosis of aorta: Secondary | ICD-10-CM | POA: Diagnosis not present

## 2023-05-25 DIAGNOSIS — J3489 Other specified disorders of nose and nasal sinuses: Secondary | ICD-10-CM | POA: Diagnosis not present

## 2023-05-31 DIAGNOSIS — M170 Bilateral primary osteoarthritis of knee: Secondary | ICD-10-CM | POA: Diagnosis not present

## 2023-06-06 DIAGNOSIS — I700 Atherosclerosis of aorta: Secondary | ICD-10-CM | POA: Diagnosis not present

## 2023-06-06 DIAGNOSIS — F17211 Nicotine dependence, cigarettes, in remission: Secondary | ICD-10-CM | POA: Diagnosis not present

## 2023-06-06 DIAGNOSIS — M1711 Unilateral primary osteoarthritis, right knee: Secondary | ICD-10-CM | POA: Diagnosis not present

## 2023-06-06 DIAGNOSIS — Z01818 Encounter for other preprocedural examination: Secondary | ICD-10-CM | POA: Diagnosis not present

## 2023-06-06 DIAGNOSIS — I712 Thoracic aortic aneurysm, without rupture, unspecified: Secondary | ICD-10-CM | POA: Diagnosis not present

## 2023-06-06 DIAGNOSIS — Z23 Encounter for immunization: Secondary | ICD-10-CM | POA: Diagnosis not present

## 2023-06-06 DIAGNOSIS — I70213 Atherosclerosis of native arteries of extremities with intermittent claudication, bilateral legs: Secondary | ICD-10-CM | POA: Diagnosis not present

## 2023-06-09 DIAGNOSIS — J301 Allergic rhinitis due to pollen: Secondary | ICD-10-CM | POA: Diagnosis not present

## 2023-06-09 DIAGNOSIS — J4540 Moderate persistent asthma, uncomplicated: Secondary | ICD-10-CM | POA: Diagnosis not present

## 2023-06-09 DIAGNOSIS — G4730 Sleep apnea, unspecified: Secondary | ICD-10-CM | POA: Diagnosis not present

## 2023-06-09 DIAGNOSIS — I2510 Atherosclerotic heart disease of native coronary artery without angina pectoris: Secondary | ICD-10-CM | POA: Diagnosis not present

## 2023-06-20 DIAGNOSIS — R062 Wheezing: Secondary | ICD-10-CM | POA: Diagnosis not present

## 2023-06-20 DIAGNOSIS — R051 Acute cough: Secondary | ICD-10-CM | POA: Diagnosis not present

## 2023-06-20 DIAGNOSIS — R519 Headache, unspecified: Secondary | ICD-10-CM | POA: Diagnosis not present

## 2023-06-20 DIAGNOSIS — R0981 Nasal congestion: Secondary | ICD-10-CM | POA: Diagnosis not present

## 2023-06-20 DIAGNOSIS — R0600 Dyspnea, unspecified: Secondary | ICD-10-CM | POA: Diagnosis not present

## 2023-07-07 DIAGNOSIS — I2510 Atherosclerotic heart disease of native coronary artery without angina pectoris: Secondary | ICD-10-CM | POA: Diagnosis not present

## 2023-07-07 DIAGNOSIS — R079 Chest pain, unspecified: Secondary | ICD-10-CM | POA: Diagnosis not present

## 2023-07-12 DIAGNOSIS — I700 Atherosclerosis of aorta: Secondary | ICD-10-CM | POA: Diagnosis not present

## 2023-07-21 DIAGNOSIS — G4730 Sleep apnea, unspecified: Secondary | ICD-10-CM | POA: Diagnosis not present

## 2023-07-21 DIAGNOSIS — J301 Allergic rhinitis due to pollen: Secondary | ICD-10-CM | POA: Diagnosis not present

## 2023-07-21 DIAGNOSIS — J4540 Moderate persistent asthma, uncomplicated: Secondary | ICD-10-CM | POA: Diagnosis not present

## 2023-07-21 DIAGNOSIS — I2510 Atherosclerotic heart disease of native coronary artery without angina pectoris: Secondary | ICD-10-CM | POA: Diagnosis not present

## 2023-07-22 DIAGNOSIS — M170 Bilateral primary osteoarthritis of knee: Secondary | ICD-10-CM | POA: Diagnosis not present

## 2023-07-22 DIAGNOSIS — M25561 Pain in right knee: Secondary | ICD-10-CM | POA: Diagnosis not present

## 2023-07-22 DIAGNOSIS — G8929 Other chronic pain: Secondary | ICD-10-CM | POA: Diagnosis not present

## 2023-08-01 DIAGNOSIS — M25761 Osteophyte, right knee: Secondary | ICD-10-CM | POA: Diagnosis not present

## 2023-08-01 DIAGNOSIS — M1711 Unilateral primary osteoarthritis, right knee: Secondary | ICD-10-CM | POA: Diagnosis not present

## 2023-08-01 DIAGNOSIS — G8918 Other acute postprocedural pain: Secondary | ICD-10-CM | POA: Diagnosis not present

## 2023-08-11 DIAGNOSIS — M25661 Stiffness of right knee, not elsewhere classified: Secondary | ICD-10-CM | POA: Diagnosis not present

## 2023-08-11 DIAGNOSIS — Z96651 Presence of right artificial knee joint: Secondary | ICD-10-CM | POA: Diagnosis not present

## 2023-08-11 DIAGNOSIS — R2689 Other abnormalities of gait and mobility: Secondary | ICD-10-CM | POA: Diagnosis not present

## 2023-08-11 DIAGNOSIS — M25561 Pain in right knee: Secondary | ICD-10-CM | POA: Diagnosis not present

## 2023-08-16 DIAGNOSIS — Z96651 Presence of right artificial knee joint: Secondary | ICD-10-CM | POA: Diagnosis not present

## 2023-08-16 DIAGNOSIS — R2689 Other abnormalities of gait and mobility: Secondary | ICD-10-CM | POA: Diagnosis not present

## 2023-08-16 DIAGNOSIS — M25561 Pain in right knee: Secondary | ICD-10-CM | POA: Diagnosis not present

## 2023-08-16 DIAGNOSIS — M25661 Stiffness of right knee, not elsewhere classified: Secondary | ICD-10-CM | POA: Diagnosis not present

## 2023-08-19 DIAGNOSIS — R2689 Other abnormalities of gait and mobility: Secondary | ICD-10-CM | POA: Diagnosis not present

## 2023-08-19 DIAGNOSIS — M25561 Pain in right knee: Secondary | ICD-10-CM | POA: Diagnosis not present

## 2023-08-19 DIAGNOSIS — M25661 Stiffness of right knee, not elsewhere classified: Secondary | ICD-10-CM | POA: Diagnosis not present

## 2023-08-19 DIAGNOSIS — Z96651 Presence of right artificial knee joint: Secondary | ICD-10-CM | POA: Diagnosis not present

## 2023-08-23 DIAGNOSIS — Z96651 Presence of right artificial knee joint: Secondary | ICD-10-CM | POA: Diagnosis not present

## 2023-08-23 DIAGNOSIS — M25561 Pain in right knee: Secondary | ICD-10-CM | POA: Diagnosis not present

## 2023-08-23 DIAGNOSIS — M25661 Stiffness of right knee, not elsewhere classified: Secondary | ICD-10-CM | POA: Diagnosis not present

## 2023-08-23 DIAGNOSIS — R2689 Other abnormalities of gait and mobility: Secondary | ICD-10-CM | POA: Diagnosis not present

## 2023-08-26 DIAGNOSIS — R2689 Other abnormalities of gait and mobility: Secondary | ICD-10-CM | POA: Diagnosis not present

## 2023-08-26 DIAGNOSIS — M25561 Pain in right knee: Secondary | ICD-10-CM | POA: Diagnosis not present

## 2023-08-26 DIAGNOSIS — M25661 Stiffness of right knee, not elsewhere classified: Secondary | ICD-10-CM | POA: Diagnosis not present

## 2023-08-26 DIAGNOSIS — Z96651 Presence of right artificial knee joint: Secondary | ICD-10-CM | POA: Diagnosis not present

## 2023-08-30 DIAGNOSIS — M25561 Pain in right knee: Secondary | ICD-10-CM | POA: Diagnosis not present

## 2023-08-30 DIAGNOSIS — Z96651 Presence of right artificial knee joint: Secondary | ICD-10-CM | POA: Diagnosis not present

## 2023-08-30 DIAGNOSIS — M25661 Stiffness of right knee, not elsewhere classified: Secondary | ICD-10-CM | POA: Diagnosis not present

## 2023-08-30 DIAGNOSIS — R2689 Other abnormalities of gait and mobility: Secondary | ICD-10-CM | POA: Diagnosis not present

## 2023-09-02 DIAGNOSIS — R2689 Other abnormalities of gait and mobility: Secondary | ICD-10-CM | POA: Diagnosis not present

## 2023-09-02 DIAGNOSIS — M25561 Pain in right knee: Secondary | ICD-10-CM | POA: Diagnosis not present

## 2023-09-02 DIAGNOSIS — M25661 Stiffness of right knee, not elsewhere classified: Secondary | ICD-10-CM | POA: Diagnosis not present

## 2023-09-02 DIAGNOSIS — Z96651 Presence of right artificial knee joint: Secondary | ICD-10-CM | POA: Diagnosis not present

## 2023-09-06 DIAGNOSIS — Z96651 Presence of right artificial knee joint: Secondary | ICD-10-CM | POA: Diagnosis not present

## 2023-09-06 DIAGNOSIS — R2689 Other abnormalities of gait and mobility: Secondary | ICD-10-CM | POA: Diagnosis not present

## 2023-09-06 DIAGNOSIS — M25561 Pain in right knee: Secondary | ICD-10-CM | POA: Diagnosis not present

## 2023-09-06 DIAGNOSIS — M25661 Stiffness of right knee, not elsewhere classified: Secondary | ICD-10-CM | POA: Diagnosis not present

## 2023-09-09 DIAGNOSIS — R2689 Other abnormalities of gait and mobility: Secondary | ICD-10-CM | POA: Diagnosis not present

## 2023-09-09 DIAGNOSIS — M25561 Pain in right knee: Secondary | ICD-10-CM | POA: Diagnosis not present

## 2023-09-09 DIAGNOSIS — Z96651 Presence of right artificial knee joint: Secondary | ICD-10-CM | POA: Diagnosis not present

## 2023-09-09 DIAGNOSIS — M25661 Stiffness of right knee, not elsewhere classified: Secondary | ICD-10-CM | POA: Diagnosis not present

## 2023-09-13 DIAGNOSIS — M25661 Stiffness of right knee, not elsewhere classified: Secondary | ICD-10-CM | POA: Diagnosis not present

## 2023-09-13 DIAGNOSIS — Z96651 Presence of right artificial knee joint: Secondary | ICD-10-CM | POA: Diagnosis not present

## 2023-09-13 DIAGNOSIS — R2689 Other abnormalities of gait and mobility: Secondary | ICD-10-CM | POA: Diagnosis not present

## 2023-09-13 DIAGNOSIS — M25561 Pain in right knee: Secondary | ICD-10-CM | POA: Diagnosis not present

## 2023-09-15 DIAGNOSIS — E785 Hyperlipidemia, unspecified: Secondary | ICD-10-CM | POA: Diagnosis not present

## 2023-09-15 DIAGNOSIS — J410 Simple chronic bronchitis: Secondary | ICD-10-CM | POA: Diagnosis not present

## 2023-09-15 DIAGNOSIS — I10 Essential (primary) hypertension: Secondary | ICD-10-CM | POA: Diagnosis not present

## 2023-09-15 DIAGNOSIS — G4733 Obstructive sleep apnea (adult) (pediatric): Secondary | ICD-10-CM | POA: Diagnosis not present

## 2023-09-15 DIAGNOSIS — F17211 Nicotine dependence, cigarettes, in remission: Secondary | ICD-10-CM | POA: Diagnosis not present

## 2023-09-15 DIAGNOSIS — I70213 Atherosclerosis of native arteries of extremities with intermittent claudication, bilateral legs: Secondary | ICD-10-CM | POA: Diagnosis not present

## 2023-09-15 DIAGNOSIS — D692 Other nonthrombocytopenic purpura: Secondary | ICD-10-CM | POA: Diagnosis not present

## 2023-09-15 DIAGNOSIS — I712 Thoracic aortic aneurysm, without rupture, unspecified: Secondary | ICD-10-CM | POA: Diagnosis not present

## 2023-09-15 DIAGNOSIS — I700 Atherosclerosis of aorta: Secondary | ICD-10-CM | POA: Diagnosis not present

## 2023-09-16 DIAGNOSIS — R2689 Other abnormalities of gait and mobility: Secondary | ICD-10-CM | POA: Diagnosis not present

## 2023-09-16 DIAGNOSIS — M25561 Pain in right knee: Secondary | ICD-10-CM | POA: Diagnosis not present

## 2023-09-16 DIAGNOSIS — M25661 Stiffness of right knee, not elsewhere classified: Secondary | ICD-10-CM | POA: Diagnosis not present

## 2023-09-16 DIAGNOSIS — Z96651 Presence of right artificial knee joint: Secondary | ICD-10-CM | POA: Diagnosis not present

## 2023-09-20 DIAGNOSIS — R2689 Other abnormalities of gait and mobility: Secondary | ICD-10-CM | POA: Diagnosis not present

## 2023-09-20 DIAGNOSIS — M25661 Stiffness of right knee, not elsewhere classified: Secondary | ICD-10-CM | POA: Diagnosis not present

## 2023-09-20 DIAGNOSIS — Z96651 Presence of right artificial knee joint: Secondary | ICD-10-CM | POA: Diagnosis not present

## 2023-09-20 DIAGNOSIS — M25561 Pain in right knee: Secondary | ICD-10-CM | POA: Diagnosis not present

## 2023-09-23 DIAGNOSIS — Z96651 Presence of right artificial knee joint: Secondary | ICD-10-CM | POA: Diagnosis not present

## 2023-09-23 DIAGNOSIS — R2689 Other abnormalities of gait and mobility: Secondary | ICD-10-CM | POA: Diagnosis not present

## 2023-09-23 DIAGNOSIS — M25561 Pain in right knee: Secondary | ICD-10-CM | POA: Diagnosis not present

## 2023-09-23 DIAGNOSIS — M25661 Stiffness of right knee, not elsewhere classified: Secondary | ICD-10-CM | POA: Diagnosis not present

## 2023-10-27 DIAGNOSIS — G4730 Sleep apnea, unspecified: Secondary | ICD-10-CM | POA: Diagnosis not present

## 2023-10-27 DIAGNOSIS — I2510 Atherosclerotic heart disease of native coronary artery without angina pectoris: Secondary | ICD-10-CM | POA: Diagnosis not present

## 2023-10-27 DIAGNOSIS — J301 Allergic rhinitis due to pollen: Secondary | ICD-10-CM | POA: Diagnosis not present

## 2023-10-27 DIAGNOSIS — J4540 Moderate persistent asthma, uncomplicated: Secondary | ICD-10-CM | POA: Diagnosis not present

## 2023-10-28 DIAGNOSIS — R0981 Nasal congestion: Secondary | ICD-10-CM | POA: Diagnosis not present

## 2023-10-28 DIAGNOSIS — R051 Acute cough: Secondary | ICD-10-CM | POA: Diagnosis not present

## 2023-10-28 DIAGNOSIS — J0100 Acute maxillary sinusitis, unspecified: Secondary | ICD-10-CM | POA: Diagnosis not present

## 2023-11-17 ENCOUNTER — Other Ambulatory Visit: Payer: Self-pay

## 2023-11-17 DIAGNOSIS — I70213 Atherosclerosis of native arteries of extremities with intermittent claudication, bilateral legs: Secondary | ICD-10-CM

## 2023-11-28 ENCOUNTER — Ambulatory Visit: Payer: Medicare Other | Admitting: Physician Assistant

## 2023-11-28 ENCOUNTER — Ambulatory Visit (INDEPENDENT_AMBULATORY_CARE_PROVIDER_SITE_OTHER)
Admission: RE | Admit: 2023-11-28 | Discharge: 2023-11-28 | Disposition: A | Discharge status: Home / Self Care | Payer: Medicare Other | Source: Ambulatory Visit | Attending: Surgery | Admitting: Surgery

## 2023-11-28 ENCOUNTER — Ambulatory Visit (HOSPITAL_COMMUNITY)
Admission: RE | Admit: 2023-11-28 | Discharge: 2023-11-28 | Disposition: A | Discharge status: Home / Self Care | Payer: Medicare Other | Source: Ambulatory Visit | Attending: Surgery | Admitting: Surgery

## 2023-11-28 VITALS — BP 151/89 | HR 66 | Temp 98.2°F | Resp 22 | Ht 73.0 in | Wt 264.0 lb

## 2023-11-28 DIAGNOSIS — I739 Peripheral vascular disease, unspecified: Secondary | ICD-10-CM

## 2023-11-28 DIAGNOSIS — I70213 Atherosclerosis of native arteries of extremities with intermittent claudication, bilateral legs: Secondary | ICD-10-CM | POA: Diagnosis not present

## 2023-11-28 LAB — VAS US ABI WITH/WO TBI
Left ABI: 1.1
Right ABI: 0.92

## 2023-11-28 NOTE — Progress Notes (Signed)
 Office Note   History of Present Illness   Jeffrey Walker is a 73 y.o. ([birth year]) male who presents for surveillance of PAD.  He has a history of bilateral common iliac artery stenting on 02/19/2022 by Dr. Myra Gianotti.  This was done for bilateral hip claudication, left greater than right.  His claudication was resolved after stenting.  The patient returns today for follow up. He denies any claudication, rest pain, or tissue loss of the lower extremities.  He recently had a right knee replacement in November and is making good progress in his healing.  He says sometimes his knee gets stiff and swollen after prolonged sitting, however it always gets better with elevation and laying down.  He does have a history of thoracic aortic aneurysm, which is being followed by his PCP.  Current Outpatient Medications  Medication Sig Dispense Refill   aspirin 81 MG chewable tablet Chew 81 mg by mouth daily.     celecoxib (CELEBREX) 100 MG capsule Take 100 mg by mouth every other day.     cetirizine (ZYRTEC) 10 MG tablet Take 10 mg by mouth daily.     clopidogrel (PLAVIX) 75 MG tablet Take 1 tablet (75 mg total) by mouth daily with breakfast. 90 tablet 1   fluticasone (FLONASE) 50 MCG/ACT nasal spray 1 spray daily.     fluticasone furoate-vilanterol (BREO ELLIPTA) 100-25 MCG/ACT AEPB 1 puff daily.     ipratropium-albuterol (DUONEB) 0.5-2.5 (3) MG/3ML SOLN 3 mLs every 4 (four) hours as needed (Asthma).     montelukast (SINGULAIR) 10 MG tablet Take 10 mg by mouth at bedtime.     mupirocin ointment (BACTROBAN) 2 % Apply 1 application. topically daily as needed (Wounds).     ramipril (ALTACE) 10 MG capsule Take 10 mg by mouth daily.     rosuvastatin (CRESTOR) 20 MG tablet Take 1 tablet (20 mg total) by mouth daily. 90 tablet 12   No current facility-administered medications for this visit.    REVIEW OF SYSTEMS (negative unless checked):   Cardiac:  []  Chest pain or chest pressure? []  Shortness of  breath upon activity? []  Shortness of breath when lying flat? []  Irregular heart rhythm?  Vascular:  []  Pain in calf, thigh, or hip brought on by walking? []  Pain in feet at night that wakes you up from your sleep? []  Blood clot in your veins? []  Leg swelling?  Pulmonary:  []  Oxygen at home? []  Productive cough? []  Wheezing?  Neurologic:  []  Sudden weakness in arms or legs? []  Sudden numbness in arms or legs? []  Sudden onset of difficult speaking or slurred speech? []  Temporary loss of vision in one eye? []  Problems with dizziness?  Gastrointestinal:  []  Blood in stool? []  Vomited blood?  Genitourinary:  []  Burning when urinating? []  Blood in urine?  Psychiatric:  []  Major depression  Hematologic:  []  Bleeding problems? []  Problems with blood clotting?  Dermatologic:  []  Rashes or ulcers?  Constitutional:  []  Fever or chills?  Ear/Nose/Throat:  []  Change in hearing? []  Nose bleeds? []  Sore throat?  Musculoskeletal:  []  Back pain? []  Joint pain? []  Muscle pain?   Physical Examination   Vitals:   11/28/23 0906  BP: (!) 151/89  Pulse: 66  Resp: (!) 22  Temp: 98.2 F (36.8 C)  TempSrc: Temporal  SpO2: 96%  Weight: 264 lb (119.7 kg)  Height: 6\' 1"  (1.854 m)   Body mass index is 34.83 kg/m.  General:  WDWN in NAD;  vital signs documented above Gait: Not observed HENT: WNL, normocephalic Pulmonary: normal non-labored breathing , without rales, rhonchi,  wheezing Cardiac: regular Abdomen: soft, NT, no masses Skin: without rashes Vascular Exam/Pulses: 2+ DP pulses bilaterally Extremities: without ischemic changes, without gangrene , without cellulitis; without open wounds;  Musculoskeletal: no muscle wasting or atrophy  Neurologic: A&O X 3;  No focal weakness or paresthesias are detected Psychiatric:  The pt has Normal affect.  Non-Invasive Vascular imaging   ABI (11/28/2023) R:  ABI: 0.92 (0.92),  PT: bi DP: bi TBI:  0.67 L:  ABI: 1.1  (1.01),  PT: bi DP: bi TBI: 0.73  Aortoiliac Duplex (11/28/2023) Patent bilateral common iliac artery stents.  50% stenosis of bilateral distal stents   Medical Decision Making   Jeffrey Walker is a 73 y.o. male who presents for surveillance of PAD  Based on the patient's vascular studies, his ABIs are essentially unchanged bilaterally.  His right ABI is 0.92 and left ABI is 1.1 Arterial duplex demonstrates patent bilateral common iliac artery stents.  There is about 50% stenosis at the distal portion of both iliac stents, however this is not flow-limiting The patient denies any claudication, rest pain, or tissue loss.  He has 2+ DP pulses bilaterally I have explained to the patient should his claudication return, or if he develops tissue loss or rest pain he should follow-up with our office sooner than normal.  Otherwise we will plan for 1 year follow-up with repeat aortoiliac duplex and ABIs   Loel Dubonnet PA-C [facility] [place] Office: [phone]  Clinic MD: Myra Gianotti

## 2024-01-05 DIAGNOSIS — J4540 Moderate persistent asthma, uncomplicated: Secondary | ICD-10-CM | POA: Diagnosis not present

## 2024-01-05 DIAGNOSIS — J301 Allergic rhinitis due to pollen: Secondary | ICD-10-CM | POA: Diagnosis not present

## 2024-01-05 DIAGNOSIS — I729 Aneurysm of unspecified site: Secondary | ICD-10-CM | POA: Diagnosis not present

## 2024-01-05 DIAGNOSIS — G4730 Sleep apnea, unspecified: Secondary | ICD-10-CM | POA: Diagnosis not present

## 2024-01-18 IMAGING — CT CT L SPINE W/ CM
1 of 7 series · 6 of 14 positions shown, 8 images · non-contrast
Comparison: MRI lumbar spine dated March 03, 2021.

CLINICAL DATA: Chronic low back and left leg pain. No prior
surgery.

EXAM:
CT LUMBAR MYELOGRAM
PROCEDURE:
Please see separate myelogram report from same day for specifics of
the injection performed separately.
TECHNIQUE: Contiguous axial images were obtained through the lumbar spine after
the intrathecal infusion of contrast. Coronal and sagittal
reconstructions were obtained of the axial image sets.

[Series 3: l spine soft · axial · 0.46mm/px · z∈[-244,-66]mm · 6 of 125 slices shown, 8 images]
[im 18/125  soft-tissue]
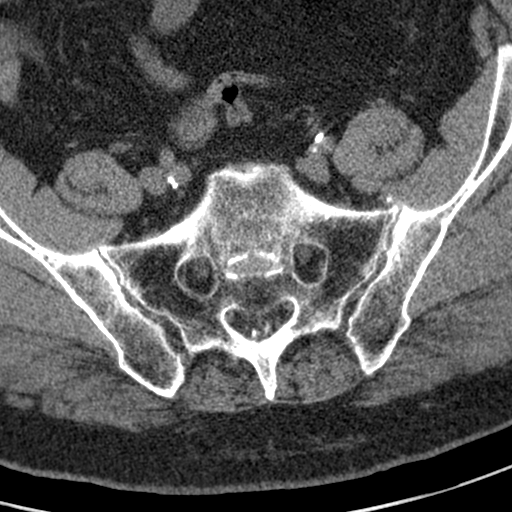
[im 18/125  bone]
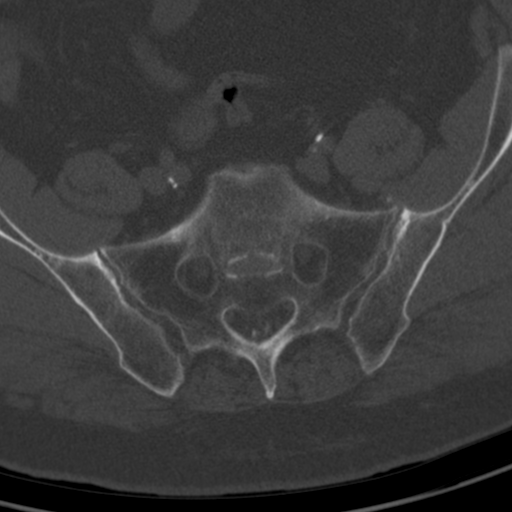
[im 36/125  bone]
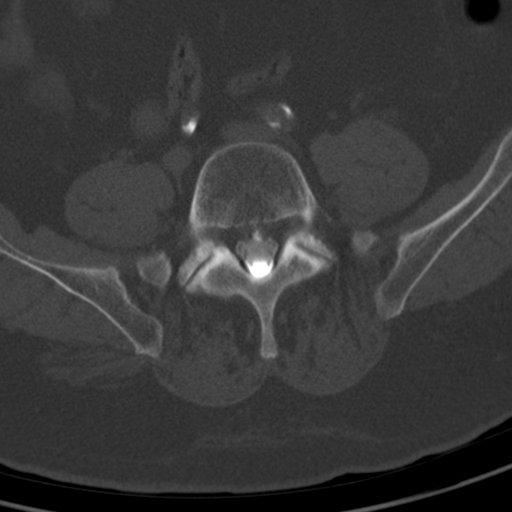
[im 54/125  bone]
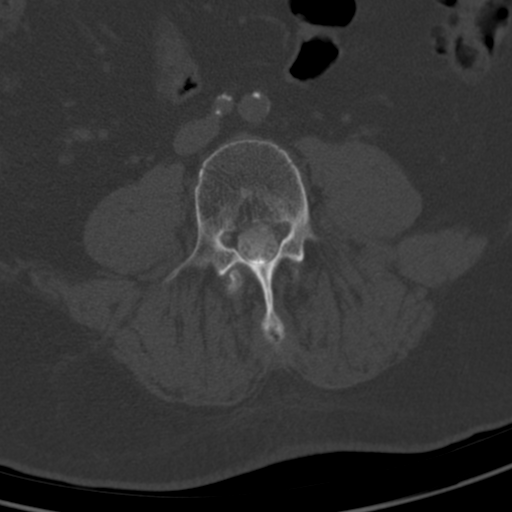
[im 71/125  bone]
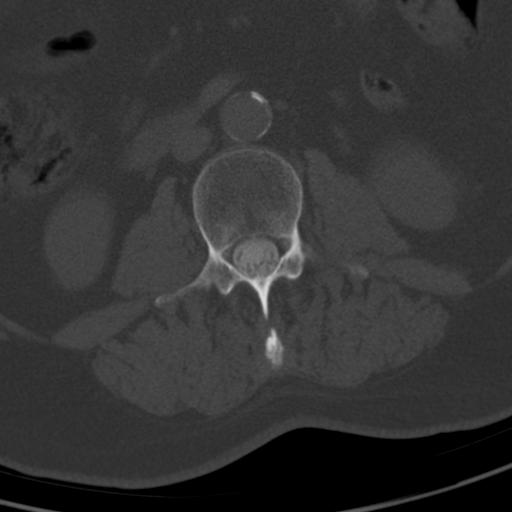
[im 89/125  soft-tissue]
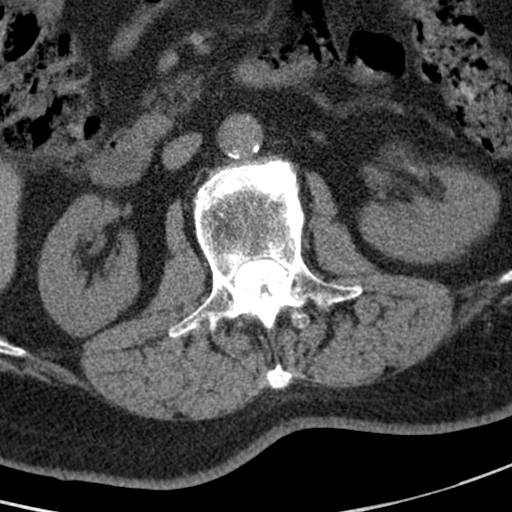
[im 89/125  bone]
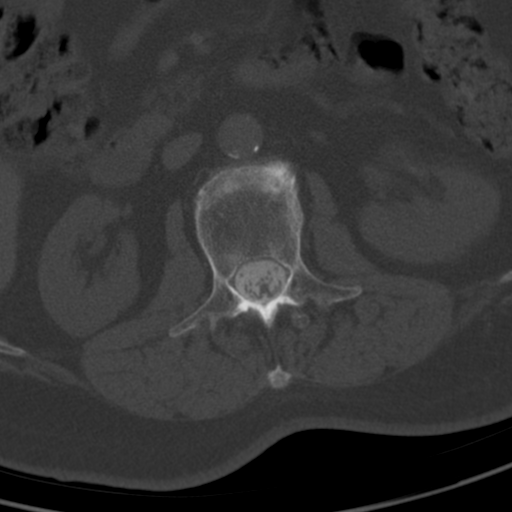
[im 107/125  bone]
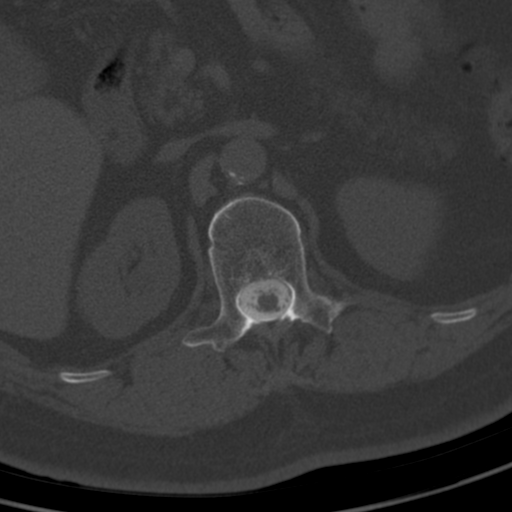

[6 of 14 positions shown; findings below may reference images not displayed]

FINDINGS: Segmentation: Standard.

Alignment: Physiologic.

Vertebrae: No acute fracture or other focal pathologic process.
Chronic L2 superior endplate compression deformity again noted.

Conus medullaris and cauda equina: Conus extends to the L2-L3 level.
Conus and cauda equina appear normal.

Paraspinal and other soft tissues: Aortoiliac atherosclerotic
vascular disease.

Disc levels:

T12-L1:  Unchanged minimal disc bulging.  No stenosis.

L1-L2:  Negative disc.  No stenosis.

L2-L3: Unchanged mild disc bulging and endplate spurring eccentric
to the left. No stenosis.

L3-L4:  Unchanged mild disc bulging.  No stenosis.

L4-L5: Unchanged mild disc bulging and left facet arthropathy. No
stenosis.

L5-S1: Unchanged small broad-based posterior disc protrusion and
mild bilateral facet arthropathy. Unchanged moderate right and mild
left neuroforaminal stenosis. No spinal canal stenosis.
IMPRESSION: 1. Similar mild lumbar spondylosis as described above. Unchanged
moderate right and mild left neuroforaminal stenosis at L5-S1.
2. Chronic L2 superior endplate compression deformity.
3. Aortic Atherosclerosis ([id number].I).

## 2024-02-01 DIAGNOSIS — E785 Hyperlipidemia, unspecified: Secondary | ICD-10-CM | POA: Diagnosis not present

## 2024-02-01 DIAGNOSIS — Z79899 Other long term (current) drug therapy: Secondary | ICD-10-CM | POA: Diagnosis not present

## 2024-02-06 DIAGNOSIS — E785 Hyperlipidemia, unspecified: Secondary | ICD-10-CM | POA: Diagnosis not present

## 2024-02-06 DIAGNOSIS — I10 Essential (primary) hypertension: Secondary | ICD-10-CM | POA: Diagnosis not present

## 2024-02-06 DIAGNOSIS — I700 Atherosclerosis of aorta: Secondary | ICD-10-CM | POA: Diagnosis not present

## 2024-02-06 DIAGNOSIS — I712 Thoracic aortic aneurysm, without rupture, unspecified: Secondary | ICD-10-CM | POA: Diagnosis not present

## 2024-02-06 DIAGNOSIS — G4733 Obstructive sleep apnea (adult) (pediatric): Secondary | ICD-10-CM | POA: Diagnosis not present

## 2024-02-06 DIAGNOSIS — Z0000 Encounter for general adult medical examination without abnormal findings: Secondary | ICD-10-CM | POA: Diagnosis not present

## 2024-02-06 DIAGNOSIS — F17211 Nicotine dependence, cigarettes, in remission: Secondary | ICD-10-CM | POA: Diagnosis not present

## 2024-02-06 DIAGNOSIS — M8580 Other specified disorders of bone density and structure, unspecified site: Secondary | ICD-10-CM | POA: Diagnosis not present

## 2024-02-06 DIAGNOSIS — M1711 Unilateral primary osteoarthritis, right knee: Secondary | ICD-10-CM | POA: Diagnosis not present

## 2024-02-06 DIAGNOSIS — I70213 Atherosclerosis of native arteries of extremities with intermittent claudication, bilateral legs: Secondary | ICD-10-CM | POA: Diagnosis not present

## 2024-02-06 DIAGNOSIS — T8484XA Pain due to internal orthopedic prosthetic devices, implants and grafts, initial encounter: Secondary | ICD-10-CM | POA: Diagnosis not present

## 2024-02-06 DIAGNOSIS — J410 Simple chronic bronchitis: Secondary | ICD-10-CM | POA: Diagnosis not present

## 2024-02-24 DIAGNOSIS — R051 Acute cough: Secondary | ICD-10-CM | POA: Diagnosis not present

## 2024-02-24 DIAGNOSIS — R0600 Dyspnea, unspecified: Secondary | ICD-10-CM | POA: Diagnosis not present

## 2024-02-24 DIAGNOSIS — R0981 Nasal congestion: Secondary | ICD-10-CM | POA: Diagnosis not present

## 2024-02-24 DIAGNOSIS — R0982 Postnasal drip: Secondary | ICD-10-CM | POA: Diagnosis not present

## 2024-02-24 DIAGNOSIS — H9203 Otalgia, bilateral: Secondary | ICD-10-CM | POA: Diagnosis not present

## 2024-04-26 DIAGNOSIS — G4730 Sleep apnea, unspecified: Secondary | ICD-10-CM | POA: Diagnosis not present

## 2024-04-26 DIAGNOSIS — R911 Solitary pulmonary nodule: Secondary | ICD-10-CM | POA: Diagnosis not present

## 2024-04-26 DIAGNOSIS — J4540 Moderate persistent asthma, uncomplicated: Secondary | ICD-10-CM | POA: Diagnosis not present

## 2024-04-26 DIAGNOSIS — J301 Allergic rhinitis due to pollen: Secondary | ICD-10-CM | POA: Diagnosis not present
